# Patient Record
Sex: Female | Born: 1940 | Race: Black or African American | Hispanic: No | Marital: Married | Smoking: Never smoker
Health system: Southern US, Community
[De-identification: ages and names within clinical notes are randomized; demographics above are authoritative.]

## PROBLEM LIST (undated history)

## (undated) DIAGNOSIS — I639 Cerebral infarction, unspecified: Secondary | ICD-10-CM

## (undated) DIAGNOSIS — R569 Unspecified convulsions: Secondary | ICD-10-CM

## (undated) DIAGNOSIS — I10 Essential (primary) hypertension: Secondary | ICD-10-CM

## (undated) HISTORY — DX: Essential (primary) hypertension: I10

---

## 2018-03-23 ENCOUNTER — Emergency Department
Admission: EM | Admit: 2018-03-23 | Discharge: 2018-03-23 | Disposition: A | Discharge status: Home / Self Care | Payer: PRIVATE HEALTH INSURANCE | Attending: Emergency Medicine | Admitting: Emergency Medicine

## 2018-03-23 ENCOUNTER — Emergency Department: Payer: PRIVATE HEALTH INSURANCE

## 2018-03-23 ENCOUNTER — Encounter: Payer: Self-pay | Admitting: Emergency Medicine

## 2018-03-23 DIAGNOSIS — R627 Adult failure to thrive: Secondary | ICD-10-CM | POA: Insufficient documentation

## 2018-03-23 DIAGNOSIS — N390 Urinary tract infection, site not specified: Secondary | ICD-10-CM | POA: Insufficient documentation

## 2018-03-23 DIAGNOSIS — R531 Weakness: Secondary | ICD-10-CM

## 2018-03-23 HISTORY — DX: Unspecified convulsions: R56.9

## 2018-03-23 LAB — URINALYSIS, COMPLETE (UACMP) WITH MICROSCOPIC
Bilirubin Urine: NEGATIVE
GLUCOSE, UA: NEGATIVE mg/dL
Ketones, ur: NEGATIVE mg/dL
Nitrite: NEGATIVE
PH: 7 (ref 5.0–8.0)
Protein, ur: NEGATIVE mg/dL
SPECIFIC GRAVITY, URINE: 1.008 (ref 1.005–1.030)
Squamous Epithelial / LPF: >50 — ABNORMAL HIGH (ref 0–5)

## 2018-03-23 LAB — CBC WITH DIFFERENTIAL/PLATELET
BASOS PCT: 0 %
Basophils Absolute: 0 10*3/uL (ref 0–0.1)
EOS ABS: 0 10*3/uL (ref 0–0.7)
EOS PCT: 0 %
HCT: 30.7 % — ABNORMAL LOW (ref 35.0–47.0)
Hemoglobin: 10.2 g/dL — ABNORMAL LOW (ref 12.0–16.0)
Lymphocytes Relative: 40 %
Lymphs Abs: 1.9 10*3/uL (ref 1.0–3.6)
MCH: 31.1 pg (ref 26.0–34.0)
MCHC: 33.4 g/dL (ref 32.0–36.0)
MCV: 93.3 fL (ref 80.0–100.0)
MONOS PCT: 10 %
Monocytes Absolute: 0.5 10*3/uL (ref 0.2–0.9)
Neutro Abs: 2.5 10*3/uL (ref 1.4–6.5)
Neutrophils Relative %: 50 %
PLATELETS: 180 10*3/uL (ref 150–440)
RBC: 3.29 MIL/uL — ABNORMAL LOW (ref 3.80–5.20)
RDW: 12.6 % (ref 11.5–14.5)
WBC: 4.9 10*3/uL (ref 3.6–11.0)

## 2018-03-23 LAB — COMPREHENSIVE METABOLIC PANEL
ALK PHOS: 56 U/L (ref 38–126)
ALT: 12 U/L — AB (ref 14–54)
AST: 25 U/L (ref 15–41)
Albumin: 3.5 g/dL (ref 3.5–5.0)
Anion gap: 7 (ref 5–15)
BUN: 16 mg/dL (ref 6–20)
CALCIUM: 9.5 mg/dL (ref 8.9–10.3)
CHLORIDE: 107 mmol/L (ref 101–111)
CO2: 24 mmol/L (ref 22–32)
CREATININE: 0.8 mg/dL (ref 0.44–1.00)
GFR calc Af Amer: >60 mL/min (ref >60)
Glucose, Bld: 88 mg/dL (ref 65–99)
Potassium: 3.8 mmol/L (ref 3.5–5.1)
Sodium: 138 mmol/L (ref 135–145)
Total Bilirubin: 1.7 mg/dL — ABNORMAL HIGH (ref 0.3–1.2)
Total Protein: 7.4 g/dL (ref 6.5–8.1)

## 2018-03-23 LAB — TROPONIN I

## 2018-03-23 MED ORDER — CEFTRIAXONE SODIUM 1 G IJ SOLR
1.0000 g | Freq: Once | INTRAMUSCULAR | Status: AC
  Administered 2018-03-23: 1 g via INTRAMUSCULAR
  Filled 2018-03-23: qty 10

## 2018-03-23 MED ORDER — LIDOCAINE HCL (PF) 1 % IJ SOLN
INTRAMUSCULAR | Status: AC
  Administered 2018-03-23: 5 mL
  Filled 2018-03-23: qty 5

## 2018-03-23 NOTE — ED Provider Notes (Signed)
[facility] Emergency Department Provider Note  ____________________________________________   First MD Initiated Contact with Patient 03/23/18 1714     (approximate)  I have reviewed the triage vital signs and the nursing notes.   HISTORY  Chief Complaint Weakness  Level 5 caveat:  history/ROS limited by altered mental status/confusion   HPI Lacey Ray is a 77 y.o. female with no contributory past medical history who presents with her family today for evaluation of a gradually worsening generalized weakness and inability to participate in her activities of daily living over the last month.  The patient's family states that there is nothing acute in onset but that is been gradually getting worse and they have not been able to convince her to come to the doctor until today.  She has no primary care doctor and she has been noncompliant for the last few years with the seizure medicine she was on for her entire life.  She has not been having any seizures of which they are aware.  She simply has gotten to the point where she can no longer care for herself or stand up and move around or even ambulate by herself.  The family is able to assist her with 1% on each side getting around the house.  She does not eat very much or drink very much.  She has no complaints and she did not want to come to the doctor.  She is more "out of it" than usual.  She does not seem to have been ill with a fever.  She is not having trouble breathing or complaining of any chest pain or abdominal pain.  She has not been having any nausea or vomiting or complaining of pain when she urinates.  Her bowel movements are basically normal.  Her symptoms are severe and gradual in onset and nothing makes it better or worse.  Past Medical History:  Diagnosis Date  . Seizures (HCC)     There are no active problems to display for this patient.   History reviewed. No pertinent surgical  history.  Prior to Admission medications   Not on File    Allergies Patient has no allergy information on record.  No family history on file.  Social History Social History   Tobacco Use  . Smoking status: Never Smoker  . Smokeless tobacco: Never Used  Substance Use Topics  . Alcohol use: Never    Frequency: Never  . Drug use: Never    Review of Systems Level 5 caveat:  history/ROS limited by altered mental status/confusion.  See the HPI above for details from the family   ____________________________________________   PHYSICAL EXAM:  VITAL SIGNS: ED Triage Vitals [03/23/18 1321]  Enc Vitals Group     BP (!) 171/78     Pulse Rate 79     Resp 18     Temp 98.6 F (37 C)     Temp Source Oral     SpO2 100 %     Weight 52.2 kg (115 lb)     Height 1.575 m ( )     Head Circumference      Peak Flow      Pain Score 0     Pain Loc      Pain Edu?      Excl. in GC?     Constitutional: Alert, seems confused but according the family she is at the baseline that she has been for about a month.  She  does answer questions and is in no acute distress but appears older than her chronological age. Eyes: Conjunctivae are normal.  Head: Atraumatic. Nose: No congestion/rhinnorhea. Mouth/Throat: Mucous membranes are moist. Neck: No stridor.  No meningeal signs.   Cardiovascular: Normal rate, regular rhythm. Good peripheral circulation. Grossly normal heart sounds. Respiratory: Normal respiratory effort.  No retractions. Lungs CTAB. Gastrointestinal: Soft and nontender. No distention.  Musculoskeletal: No lower extremity tenderness nor edema. No gross deformities of extremities. Neurologic: Decreased and slow speech and language but she will answer questions appropriately. No gross focal neurologic deficits are appreciated; although she seems to have some contractions of her legs, she felt very strongly when the had to get in and out catheterization and she clearly has good  muscle strength throughout both arms and legs. Skin:  Skin is warm, dry and intact. No rash noted.   ____________________________________________   LABS (all labs ordered are listed, but only abnormal results are displayed)  Labs Reviewed  COMPREHENSIVE METABOLIC PANEL - Abnormal; Notable for the following components:      Result Value   ALT 12 (*)    Total Bilirubin 1.7 (*)    All other components within normal limits  CBC WITH DIFFERENTIAL/PLATELET - Abnormal; Notable for the following components:   RBC 3.29 (*)    Hemoglobin 10.2 (*)    HCT 30.7 (*)    All other components within normal limits  URINALYSIS, COMPLETE (UACMP) WITH MICROSCOPIC - Abnormal; Notable for the following components:   Color, Urine YELLOW (*)    APPearance TURBID (*)    Hgb urine dipstick SMALL (*)    Leukocytes, UA MODERATE (*)    Bacteria, UA RARE (*)    Squamous Epithelial / LPF >50 (*)    All other components within normal limits  URINE CULTURE  TROPONIN I   ____________________________________________  EKG  ED ECG REPORT I, Loleta Aviv, the attending physician, personally viewed and interpreted this ECG.  Date: 03/23/2018 EKG Time: 13: 27 Rate: 70 Rhythm: normal sinus rhythm QRS Axis: normal Intervals: normal ST/T Wave abnormalities: Non-specific ST segment / T-wave changes, but no evidence of acute ischemia. Narrative Interpretation: no evidence of acute ischemia   ____________________________________________  RADIOLOGY   ED MD interpretation: No indication of acute issues on head CT and brain MRI.  Patient has chronic changes  Official radiology report(s): Ct Head Wo Contrast  Result Date: 03/23/2018 CLINICAL DATA:  Weakness in hands and feet. EXAM: CT HEAD WITHOUT CONTRAST TECHNIQUE: Contiguous axial images were obtained from the base of the skull through the vertex without intravenous contrast. COMPARISON:  None. FINDINGS: Brain: No subdural, epidural, or subarachnoid  hemorrhage. Ventricles and sulci are prominent. There is increased CSF around the cerebellum suggesting some cerebellar atrophy. No masses. Basal cisterns are normal. No midline shift. White matter changes are identified. No acute cortical ischemia or infarct is noted. Vascular: Calcified atherosclerotic changes in the intracranial carotids. Skull: Normal. Negative for fracture or focal lesion. Sinuses/Orbits: No acute finding. Other: None. IMPRESSION: 1. No acute intracranial abnormality. 2. Relative cerebellar atrophy of uncertain etiology. 3. White matter changes. Electronically Signed   By: Gerome Sam III M.D   On: 03/23/2018 14:00   Mr Brain Wo Contrast  Result Date: 03/23/2018 CLINICAL DATA:  Initial evaluation for generalized weakness, more prominent on the right, slurred speech. EXAM: MRI HEAD WITHOUT CONTRAST TECHNIQUE: Multiplanar, multiecho pulse sequences of the brain and surrounding structures were obtained without intravenous contrast. COMPARISON:  Prior CT from earlier  the same day. FINDINGS: Brain: Study degraded by motion artifact. Generalized cerebral atrophy, with fairly advanced cerebellar atrophy. This could be related to chronic antiepileptic therapy given history of seizure. Patchy T2/FLAIR hyperintensity within the periventricular and deep white matter both cerebral hemispheres, most consistent with chronic small vessel ischemic changes. Chronic microvascular changes present within the pons as well. Superimposed multiple remote lacunar infarcts seen involving the bilateral basal ganglia/corona radiata as well as the thalami. No abnormal foci of restricted diffusion to suggest acute or subacute ischemia. Gray-white matter differentiation maintained. No other areas of remote cortical infarction. No acute intracranial hemorrhage. Few punctate foci susceptibility artifact scattered throughout the supratentorial brain, consistent with small chronic micro hemorrhages. Findings suspected to  be hypertensive in nature, although amyloid angiopathy could conceivably have this appearance as well. No mass lesion, midline shift or mass effect. No hydrocephalus. No extra-axial fluid collection. Major dural sinuses are grossly patent. Pituitary gland suprasellar region normal. Midline structures intact and normal. Vascular: Major intracranial vascular flow voids are maintained. Skull and upper cervical spine: Craniocervical junction normal. Moderate degenerate spondylolysis noted within the upper cervical spine with moderate diffuse spinal stenosis. Bone marrow signal intensity within normal limits. No scalp soft tissue abnormality. Sinuses/Orbits: Globes and orbital soft tissues within normal limits. Paranasal sinuses are clear. No mastoid effusion. Inner ear structures normal. Other: None. IMPRESSION: 1. No acute intracranial abnormality. 2. 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 3. Advanced cerebellar atrophy, which could be related to chronic antiepileptic therapy given history of seizure. 4. 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 Finding could be further assessed with dedicated MRI of the cervical spine as clinically desired. Electronically Signed   By: Rise Mu M.D.   On: 03/23/2018 20:21    ____________________________________________   PROCEDURES  Critical Care performed: No   Procedure(s) performed:   Procedures   ____________________________________________   INITIAL IMPRESSION / ASSESSMENT AND PLAN / ED COURSE  As part of my medical decision making, I reviewed the following data within the electronic MEDICAL RECORD NUMBER History obtained from family, Nursing notes reviewed and incorporated, Labs reviewed , EKG interpreted  and Old chart reviewed    Differential diagnosis includes, but is not  limited to, failure to thrive and gradual decline over time, dementia, CVA (subacute), acute infection such as urinary tract infection, metabolic abnormality, less likely ACS.  I will perform a broad work-up but I explained to the family that if there is no evidence of any acute or treatable issue at this time, we may need to talk about placement after PT/OT evaluation and social work involvement.  However I will evaluate broadly and obtain an MR of her brain to look for any evidence of recent infarction.  She has good muscle strength throughout, it is almost as if she does not want to participate in her activities.  The family is in agreement with the current plan.  Clinical Course as of Mar 24 14  Mon Mar 23, 2018  1836 Unclear UA results, possible UTI, but heavy squamous cell involvement, WBCs may simply represent contamination.  Urinalysis, Complete w Microscopic(!) [CF]  2042 The patient's clinical status is unchanged.  More family members are currently present including both her daughter and her husband.  I explained the situation, that there may be a mild urinary tract infection but that her MR brain did not show any acute abnormalities.  I explained that I do not have any inpatient criteria by which to admit her, but I could keep her in the emergency department for physical therapy/occupational therapy evaluation and social work placement, but I also explained that placement in a skilled nursing facility or rehab facility is not guaranteed and that will be dependent upon the finances and insurance payments.  The family is all comfortable with taking her home tonight and try to get established with a primary care doctor.  I will provide the patient navigator's phone number and encourage close outpatient follow-up as soon as possible.  I also gave strict return precautions if her clinical status decreases.Because of her unwillingness to cooperate with taking medications, we will give her a dose of  Rocephin 1 g intramuscular that should treat mild urinary tract infections.  A urine culture is pending and she will be contacted if they need a different type of treatment.   [CF]    Clinical Course User Index [CF] Loleta Haifa, MD    ____________________________________________  FINAL CLINICAL IMPRESSION(S) / ED DIAGNOSES  Final diagnoses:  Generalized weakness  Urinary tract infection without hematuria, site unspecified  Failure to thrive in adult     MEDICATIONS GIVEN DURING THIS VISIT:  Medications  cefTRIAXone (ROCEPHIN) injection 1 g (1 g Intramuscular Given 03/23/18 2145)  lidocaine (PF) (XYLOCAINE) 1 % injection (5 mLs  Given 03/23/18 2146)     ED Discharge Orders    None       Note:  This document was prepared using Dragon voice recognition software and may include unintentional dictation errors.    Loleta Mehak, MD 03/24/18 [phone]

## 2018-03-23 NOTE — ED Triage Notes (Signed)
Pt arrived with family with complaints of weakness in hands and feet. Per family this has been going months and they are had a hard time getting her to come in. Baseline is walking independently and oriented x 4. Pt's speech slurred and delayed which family states started "months ago." Pt does have seizures but has not been cooperative with regimen for "awhile" per family. Last seizure reports a month ago. More weakness prominent of the right side.

## 2018-03-23 NOTE — Discharge Instructions (Signed)
Your workup in the Emergency Department today was reassuring.  We did not find any specific abnormalities other than possibly a very mild urinary tract infection for which we gave you a shot of antibiotics.  The most important thing is to get established with a primary care doctor to evaluate your options for home health or assisted living placement.  We offered to keep you in the emergency department tonight for physical therapy and occupational therapy evaluation tomorrow and for social worker to talk with you about placement in another living facility, but your family declined and we think it is appropriate at this time based on the fact that you have a lot of help at home.  However, please return to the Emergency Department if you develop new or worsening symptoms that concern you.

## 2018-03-23 NOTE — ED Notes (Signed)
Patient transported to CT/MRI 

## 2018-03-26 LAB — URINE CULTURE: SPECIAL REQUESTS: NORMAL

## 2018-03-27 NOTE — Progress Notes (Signed)
ED CULTURE   77 yo female presented to the ED with generalized weakness and failure to thrive. She did not present with any flank pain or symptoms of dysuria or fever. She also has a history of non-compliance and does not have a PCP. Her UA was dirty with > 50 squamous cells and also showed rare bacteria and some WBC's and a urine culture was obtained. She was diagnosed with a mild UTI and treated with Ceftriaxone 1g IM once d/t concerns about compliance. Urine culture resulted on 03/27/2018 showing E coli sensitive to Ceftriaxone. Given lack of UTI symptoms, dirty UA, and that E coli was sensitive to Ceftriaxone, no further treatment is needed at this time.   Results for orders placed or performed during the hospital encounter of 03/23/18  Urine Culture     Status: Abnormal   Collection Time: 03/23/18  6:10 PM  Result Value Ref Range Status   Specimen Description   Final    URINE, CATHETERIZED Performed at [facility], [address] [place], [place] [address]    Special Requests   Final    Normal Performed at [facility], [address] [place], [place] [address]    Culture >=100,000 COLONIES/mL ESCHERICHIA COLI (A)  Final   Report Status 03/26/2018 FINAL  Final   Organism ID, Bacteria ESCHERICHIA COLI (A)  Final      Susceptibility   Escherichia coli - MIC*    AMPICILLIN <=2 SENSITIVE Sensitive     CEFAZOLIN <=4 SENSITIVE Sensitive     CEFTRIAXONE <=1 SENSITIVE Sensitive     CIPROFLOXACIN 0.5 SENSITIVE Sensitive     GENTAMICIN <=1 SENSITIVE Sensitive     IMIPENEM <=0.25 SENSITIVE Sensitive     NITROFURANTOIN 32 SENSITIVE Sensitive     TRIMETH/SULFA <=20 SENSITIVE Sensitive     AMPICILLIN/SULBACTAM <=2 SENSITIVE Sensitive     * >=100,000 COLONIES/mL ESCHERICHIA COLI    Yolanda Bonine, PharmD Pharmacy Resident

## 2018-04-16 ENCOUNTER — Encounter: Payer: Self-pay | Admitting: Nurse Practitioner

## 2018-04-16 ENCOUNTER — Ambulatory Visit (INDEPENDENT_AMBULATORY_CARE_PROVIDER_SITE_OTHER): Payer: PRIVATE HEALTH INSURANCE | Admitting: Nurse Practitioner

## 2018-04-16 VITALS — BP 193/79 | HR 66 | Temp 98.5°F | Ht 62.0 in

## 2018-04-16 DIAGNOSIS — I10 Essential (primary) hypertension: Secondary | ICD-10-CM | POA: Diagnosis not present

## 2018-04-16 DIAGNOSIS — Z7689 Persons encountering health services in other specified circumstances: Secondary | ICD-10-CM | POA: Diagnosis not present

## 2018-04-16 DIAGNOSIS — R569 Unspecified convulsions: Secondary | ICD-10-CM

## 2018-04-16 DIAGNOSIS — N3001 Acute cystitis with hematuria: Secondary | ICD-10-CM | POA: Diagnosis not present

## 2018-04-16 DIAGNOSIS — R531 Weakness: Secondary | ICD-10-CM | POA: Diagnosis not present

## 2018-04-16 DIAGNOSIS — R627 Adult failure to thrive: Secondary | ICD-10-CM | POA: Diagnosis not present

## 2018-04-16 LAB — POCT URINALYSIS DIPSTICK
Bilirubin, UA: NEGATIVE
Glucose, UA: NEGATIVE
Ketones, UA: NEGATIVE
Nitrite, UA: POSITIVE
Protein, UA: NEGATIVE
Spec Grav, UA: 1.015 (ref 1.010–1.025)
Urobilinogen, UA: 0.2 E.U./dL
pH, UA: 6.5 (ref 5.0–8.0)

## 2018-04-16 MED ORDER — AMLODIPINE BESYLATE 5 MG PO TABS: 5.0000 mg | ORAL_TABLET | Freq: Every day | ORAL | 3 refills | Status: DC

## 2018-04-16 MED ORDER — CEPHALEXIN 500 MG PO CAPS: 500.0000 mg | ORAL_CAPSULE | Freq: Two times a day (BID) | ORAL | 0 refills | Status: AC

## 2018-04-16 MED ORDER — LEVETIRACETAM 500 MG PO TABS: 500.0000 mg | ORAL_TABLET | Freq: Two times a day (BID) | ORAL | 2 refills | Status: DC

## 2018-04-16 NOTE — Progress Notes (Signed)
Subjective:    Patient ID: Lacey Ray, female    DOB: 10/19/1941, 77 y.o.   MRN: [id number]  Lacey Ray is a 77 y.o. female presenting on 04/16/2018 for Establish Care (recently seen in the ED on 5/6 diagnosed w/ failure thrive and UTI)   HPI Establish Care New Provider Pt last seen by PCP Dr. Maryellen Pile in 2002.  Pt is accompanied today to clinic by her daughter Amie Critchley, a family friend, and her husband Lyda Jester.  ED Followup: UTI Pt has most recently been seen in ED and was diagnosed with UTI.  UTI was only treated with single dose IM Rocephin, which is not likely to have resolved UTI.  Culture at that time confirmed e.Coli UTI without any drug resistance.  Failure to Thrive  Pt and family report she has a good appetite and eats regular meals.  States she eats 3 meals per day.  She has had significant muscle weakness for several years per family.  Pt's husband Lyda Jester is her primary caregiver and is able to lift patient as needed for assisting with ADLs and iADLs.  Pt also gets significant assistance from family and friends.  Seizure Disorder Last seizure was about 3 months ago. Family describes seizures as pt "falls asleep and is disoriented and takes time to become alert when waking up."  Previously were grand mal seizures.  Much more mild now.  Family denies absence seizures. - No seizure medication as last refill was in 2002.   - In ED, CT head was performed without evidence of stroke.  Per family they were not able to dx R arm weakness as CVA.  They also report she has had speech difficulty      Past Medical History:  Diagnosis Date  . Seizures (HCC)    No past surgical history on file. Social History   Socioeconomic History  . Marital status: Married    Spouse name: Not on file  . Number of children: Not on file  . Years of education: Not on file  . Highest education level: Not on file  Occupational History  . Not on file  Social Needs  . Financial resource strain:  Not on file  . Food insecurity:    Worry: Not on file    Inability: Not on file  . Transportation needs:    Medical: Not on file    Non-medical: Not on file  Tobacco Use  . Smoking status: Never Smoker  . Smokeless tobacco: Never Used  Substance and Sexual Activity  . Alcohol use: Never    Frequency: Never  . Drug use: Never  . Sexual activity: Not on file  Lifestyle  . Physical activity:    Days per week: Not on file    Minutes per session: Not on file  . Stress: Not on file  Relationships  . Social connections:    Talks on phone: Not on file    Gets together: Not on file    Attends religious service: Not on file    Active member of club or organization: Not on file    Attends meetings of clubs or organizations: Not on file    Relationship status: Not on file  . Intimate partner violence:    Fear of current or ex partner: Not on file    Emotionally abused: Not on file    Physically abused: Not on file    Forced sexual activity: Not on file  Other Topics Concern  . Not  on file  Social History Narrative  . Not on file   No family history on file. No current outpatient medications on file prior to visit.   No current facility-administered medications on file prior to visit.     Review of Systems  Constitutional: Negative for activity change, appetite change and unexpected weight change.  HENT: Negative.   Eyes: Negative.   Respiratory: Negative.   Cardiovascular: Negative.   Gastrointestinal: Negative.   Endocrine: Negative.   Genitourinary: Positive for difficulty urinating (incontinence).  Musculoskeletal: Positive for gait problem (Pt unable to walk independently).  Skin: Negative.   Allergic/Immunologic: Negative.   Neurological: Positive for seizures, facial asymmetry, speech difficulty and weakness.  Hematological: Negative.   Psychiatric/Behavioral: Positive for decreased concentration and sleep disturbance. Negative for agitation and behavioral  problems.   Per HPI unless specifically indicated above     Objective:    BP (!) 193/79 (BP Location: Right Arm, Patient Position: Sitting, Cuff Size: Small)   Pulse 66   Temp 98.5 F (36.9 C) (Oral)   Ht  (1.575 m)   SpO2 100%   BMI 21.03 kg/m   Wt Readings from Last 3 Encounters:  03/23/18 115 lb (52.2 kg)    Physical Exam  Constitutional: She is oriented to person, place, and time. Vital signs are normal. She appears well-developed and well-nourished. She appears lethargic. She appears cachectic. No distress.  HENT:  Head: Normocephalic and atraumatic.  Cardiovascular: Normal rate, regular rhythm, S1 normal, S2 normal, normal heart sounds and intact distal pulses.  Pulmonary/Chest: Effort normal and breath sounds normal. No respiratory distress.  Abdominal: Soft.  Neurological: She is oriented to person, place, and time. She appears lethargic. She displays atrophy. She displays no tremor. No sensory deficit. She exhibits abnormal muscle tone (2/5 strength legs, 3/5 strength arms). She displays seizure activity.  Pt has had multiple episodes during exam c/w absence seizures.  Pt stares off without engagement in conversation and picks back up when she is able after absence event.  Skin: Skin is warm and dry. Capillary refill takes less than 2 seconds.  Psychiatric: Her affect is blunt. Her speech is delayed. She is withdrawn. Cognition and memory are impaired. She does not express impulsivity or inappropriate judgment. She is communicative. She is inattentive.  Vitals reviewed.  Results for orders placed or performed in visit on 04/16/18  POCT urinalysis dipstick  Result Value Ref Range   Color, UA dark color    Clarity, UA cloudy    Glucose, UA Negative Negative   Bilirubin, UA negative    Ketones, UA negative    Spec Grav, UA 1.015 1.010 - 1.025   Blood, UA moderate    pH, UA 6.5 5.0 - 8.0   Protein, UA Negative Negative   Urobilinogen, UA 0.2 0.2 or 1.0 E.U./dL    Nitrite, UA positive    Leukocytes, UA Moderate (2+) (A) Negative   Appearance     Odor        Assessment & Plan:   Problem List Items Addressed This Visit      Cardiovascular and Mediastinum   Essential hypertension Uncontrolled hypertension.  BP goal < 140/90 given frail elder adult.  Not currently taking medications.  No currently identified complications, but pt at risk for hemorrhagic stroke at current BP on exam today.  Plan: 1. Start taking amlodipine 5 mg once daily 2. Labs reviewed from ED  3. Encouraged heart healthy diet and increasing exercise to 30 minutes most  days of the week. 4. Check BP 1-2 x per week at home, keep log, and bring to clinic at next appointment. 5. Follow up 3 months and sooner if BP remains elevated.   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 Extremity weakness, speech slurring, delayed speech are all signs of chronic, uncontrolled seizures.  Pt was previously managed on phenobarbital and dilantin.  Family reported pt did not tolerate dilantin.  Plan: 1. START Keppra twice daily for seizures. 2. Referral neurology - pt will need EEG. 3. Followup 3 months.  keppra level at that time.   Relevant Medications   levETIRAcetam (KEPPRA) 500 MG tablet   Other Relevant Orders   Ambulatory referral to Neurology   Generalized weakness   Relevant Orders   Ambulatory referral to Home Health   Failure to thrive in adult Pt with significant weakness, muscle atrophy and cachexia.  No suspicion of neglect by family.  Pt is well groomed and reports good appetite/regular meal intake.  Likely is result of seizures/lack of activity unmanaged for many years.  Plan: 1. Seizure management as above. 2. Encouraged high calorie foods. 3. Referral home health for PT eval /treat.  Goal will be easier transfers for patient for toileting and self care.   Relevant Orders     Ambulatory referral to Home Health    Other Visit Diagnoses    Acute cystitis with hematuria    -  Primary Persistent UTI noted on UA today.  Inadequate treatment regimen/followup from ED visit 03/23/2018.   Likely is also contributing to patient's neuro status today. No s/sx of systemic infection today.  Plan: 1. START Keflex 500 mg bid x 7 days. 2. Reviewed s/sx of UTI with family.  Return to clinic if not resolving.   Relevant Medications   cephALEXin (KEFLEX) 500 MG capsule   Other Relevant Orders   POCT urinalysis dipstick (Completed)   Encounter to establish care     Previous PCP was at Dr. Maryellen Pile in 2002.  Records will not be requested.  Past medical, family, and surgical history reviewed w/ pt and family in clinic today.       Meds ordered this encounter  Medications  . levETIRAcetam (KEPPRA) 500 MG tablet    Sig: Take 1 tablet (500 mg total) by mouth 2 (two) times daily.    Dispense:  60 tablet    Refill:  2    Order Specific Question:   Supervising Provider    Answer:   Smitty Cords [[id number]]  . amLODipine (NORVASC) 5 MG tablet    Sig: Take 1 tablet (5 mg total) by mouth daily.    Dispense:  90 tablet    Refill:  3    Order Specific Question:   Supervising Provider    Answer:   Smitty Cords [[id number]]  . cephALEXin (KEFLEX) 500 MG capsule    Sig: Take 1 capsule (500 mg total) by mouth 2 (two) times daily for 7 days.    Dispense:  14 capsule    Refill:  0    Order Specific Question:   Supervising Provider    Answer:   Smitty Cords [[id number]]     Follow up plan: Return in about 3 months (around 07/17/2018) for hypertension, seizures.  Wilhelmina Mcardle, DNP, AGPCNP-BC Adult Gerontology Primary Care Nurse Practitioner [facility]  04/17/2018, 9:21 AM

## 2018-04-16 NOTE — Patient Instructions (Addendum)
Lacey Ray,   Thank you for coming in to clinic today.  1. Blood pressure goal <140/90.   - START amlodipine 5 mg once daily.  Call clinic if BP > 160/100 in 1-2 weeks when checked at home.   2. Seizures: START Keppra (levitracetam) 500 mg twice daily.   Please schedule a follow-up appointment with Wilhelmina Mcardle, AGNP. Return in about 3 months (around 07/17/2018) for hypertension, seizures.  OR 1 month for blood pressure if unable to check at home.  If you have any other questions or concerns, please feel free to call the clinic or send a message through MyChart. You may also schedule an earlier appointment if necessary.  You will receive a survey after today's visit either digitally by e-mail or paper by [employer]. Your experiences and feedback matter to [place].  Please respond so we know how we are doing as we provide care for you.   Wilhelmina Mcardle, DNP, AGNP-BC Adult Gerontology Nurse Practitioner [facility], [facility]

## 2018-04-17 ENCOUNTER — Encounter: Payer: Self-pay | Admitting: Nurse Practitioner

## 2018-04-17 DIAGNOSIS — I10 Essential (primary) hypertension: Secondary | ICD-10-CM | POA: Insufficient documentation

## 2018-04-17 DIAGNOSIS — R627 Adult failure to thrive: Secondary | ICD-10-CM | POA: Insufficient documentation

## 2018-04-17 DIAGNOSIS — R531 Weakness: Secondary | ICD-10-CM | POA: Insufficient documentation

## 2018-04-17 DIAGNOSIS — R569 Unspecified convulsions: Secondary | ICD-10-CM | POA: Insufficient documentation

## 2018-06-29 ENCOUNTER — Ambulatory Visit (INDEPENDENT_AMBULATORY_CARE_PROVIDER_SITE_OTHER): Payer: PRIVATE HEALTH INSURANCE | Admitting: Nurse Practitioner

## 2018-06-29 ENCOUNTER — Encounter: Payer: Self-pay | Admitting: Nurse Practitioner

## 2018-06-29 VITALS — BP 146/60 | HR 60 | Temp 98.6°F | Ht 62.0 in

## 2018-06-29 DIAGNOSIS — R569 Unspecified convulsions: Secondary | ICD-10-CM | POA: Diagnosis not present

## 2018-06-29 DIAGNOSIS — I10 Essential (primary) hypertension: Secondary | ICD-10-CM

## 2018-06-29 DIAGNOSIS — R627 Adult failure to thrive: Secondary | ICD-10-CM | POA: Diagnosis not present

## 2018-06-29 MED ORDER — AMLODIPINE BESYLATE 10 MG PO TABS: 10.0000 mg | ORAL_TABLET | Freq: Every day | ORAL | 1 refills | Status: DC

## 2018-06-29 NOTE — Patient Instructions (Addendum)
Ernesto Rutherfordose M Vanhook,   Thank you for coming in to clinic today.  1. Continue Keppra without changes.  2. INCREASE amlodipine 10 mg once daily.  3. Labs today at [facility].  If you only get one lab collected, have levetiracetam level drawn please.  All are important, but that is the most important for her new medication.  Please schedule a follow-up appointment with Wilhelmina McardleLauren Tacy Chavis, AGNP. Return in about 3 months (around 09/29/2018) for hypertension AND seizures.  If you have any other questions or concerns, please feel free to call the clinic or send a message through MyChart. You may also schedule an earlier appointment if necessary.  You will receive a survey after today's visit either digitally by e-mail or paper by [employer] mail. Your experiences and feedback matter to us.  Please respond so we know how we are doing as we provide care for you.   Wilhelmina McardleLauren Lada Fulbright, DNP, AGNP-BC Adult Gerontology Nurse Practitioner [facility], [facility]

## 2018-06-29 NOTE — Assessment & Plan Note (Addendum)
Patient appears to have had some weight gain since last visit.  Remains malnourished with mild cachexia.  Encourage patient and family to do simple exercises daily to lift leg off chair and extend lower leg to straighten knee while seated and bring back to bent, resting state.  Continue regular oral intake of food.  Consider Boost/Ensure supplement if tolerated.  Encourage adequate protein intake. Labs today.  Evaluate deficiency anemia, electrolyte abnormalities. Followup 3 months.

## 2018-06-29 NOTE — Progress Notes (Signed)
Subjective:    Patient ID: Lacey RutherfordRose M Starnes, female    DOB: 01/21/1941, 77 y.o.   MRN: [id number]  Lacey Ray is a 77 y.o. female presenting on 06/29/2018 for Hypertension and Seizures   HPI Hypertension - She is not checking BP at home or outside of clinic.    - Current medications: amlodipine 5 mg once daily, tolerating well without side effects - She is not currently symptomatic. - Pt denies headache, lightheadedness, dizziness, changes in vision, chest tightness/pressure, palpitations, leg swelling, sudden loss of speech or loss of consciousness. - She  reports no regular exercise routine. - Her diet is moderate in salt, moderate in fat, and moderate in carbohydrates.   Seizure disorder Patient has started taking levetiracetam 500 mg bid.  She has had significant reduction in daytime napping according to family.  They have not noticed much difference in her communication or attention, but is not spacing out as often.  Patient and family report neurology visit is upcoming next week.  She is scheduled to see Dr. Cristopher PeruHemang Shah on 07/02/2018.  Failure to Thrive Patient continues to eat well.  Patient and family state it appears she may have gained weight since last visit.  Social History   Tobacco Use  . Smoking status: Never Smoker  . Smokeless tobacco: Never Used  Substance Use Topics  . Alcohol use: Never    Frequency: Never  . Drug use: Never    Review of Systems Per HPI unless specifically indicated above     Objective:    BP (!) 146/60 (BP Location: Right Arm, Patient Position: Sitting, Cuff Size: Small)   Pulse 60   Temp 98.6 F (37 C) (Oral)   Ht 5\' 2"  (1.575 m)   BMI 21.03 kg/m    Wt Readings from Last 3 Encounters:  03/23/18 115 lb (52.2 kg)    Physical Exam  Constitutional: She appears well-developed and well-nourished. No distress.  HENT:  Head: Normocephalic and atraumatic.  Cardiovascular: Normal rate, regular rhythm, S1 normal, S2 normal, normal  heart sounds and intact distal pulses.  Pulmonary/Chest: Effort normal and breath sounds normal. No respiratory distress.  Neurological: She is alert. She is disoriented (can state her name only,  Disoriented to time, place, situation). She displays atrophy. She exhibits abnormal muscle tone (very weak with strength of all extremities 2/5). Gait (pt is non-ambulatory 2/2 decreased strength) abnormal. GCS eye subscore is 4. GCS verbal subscore is 5. GCS motor subscore is 6.  Unable to complete full CN exam.  No limitations of CN 2-10.  Patient was unable to follow instructions to complete CN 11-12.  Skin: Skin is warm and dry. Capillary refill takes less than 2 seconds.  Psychiatric: She has a normal mood and affect. Her speech is delayed. She is slowed and withdrawn. Cognition and memory are impaired. She expresses inappropriate judgment. She does not express impulsivity. She is attentive.  Vitals reviewed.    Results for orders placed or performed in visit on 04/16/18  POCT urinalysis dipstick  Result Value Ref Range   Color, UA dark color    Clarity, UA cloudy    Glucose, UA Negative Negative   Bilirubin, UA negative    Ketones, UA negative    Spec Grav, UA 1.015 1.010 - 1.025   Blood, UA moderate    pH, UA 6.5 5.0 - 8.0   Protein, UA Negative Negative   Urobilinogen, UA 0.2 0.2 or 1.0 E.U./dL   Nitrite, UA positive  Leukocytes, UA Moderate (2+) (A) Negative   Appearance     Odor        Assessment & Plan:   Problem List Items Addressed This Visit      Cardiovascular and Mediastinum   Essential hypertension - Primary    Currently improved, but remains above goal and uncontrolled.  BP goal < 140/90 due to medical frailty in elder adult.  Pt is not working on lifestyle modifications.  Taking medications tolerating well without side effects. No currently identified complications, but no renal function check to date.  Plan: 1. Increase amlodipine to 10 mg once daily.  2. Obtain  labs CMP, CBC, keppra level today  3. Encouraged heart healthy diet and increasing exercise to 30 minutes most days of the week. 4. Check BP 1-2 x per week at home, keep log, and bring to clinic at next appointment. 5. Follow up 3 months.        Relevant Medications   amLODipine (NORVASC) 10 MG tablet   Other Relevant Orders   Comprehensive metabolic panel   CBC with Differential/Platelet   Levetiracetam level     Other   Seizure (HCC)    Likely improved on Keppra with evidence of fewer post-ictal periods with fewer daytime naps.  Likely, patient was experiencing absence seizures, which have reduced in frequency.  Cognitive ability still lags, but patient is now gaining some weight and appears to be mildly stronger on exam today.  Plan: 1. Continue Keppra 2. Lab to draw Keppra level today or tomorrow. 3. Continue with Neurology appointment as scheduled with Dr. Sherryll BurgerShah. 4. Followup 3 months and as needed.      Relevant Orders   Comprehensive metabolic panel   CBC with Differential/Platelet   Levetiracetam level   Failure to thrive in adult    Patient appears to have had some weight gain since last visit.  Remains malnourished with mild cachexia.  Encourage patient and family to do simple exercises daily to lift leg off chair and extend lower leg to straighten knee while seated and bring back to bent, resting state.  Continue regular oral intake of food.  Consider Boost/Ensure supplement if tolerated.  Encourage adequate protein intake. Labs today.  Evaluate deficiency anemia, electrolyte abnormalities. Followup 3 months.      Relevant Orders   Comprehensive metabolic panel   CBC with Differential/Platelet      Meds ordered this encounter  Medications  . amLODipine (NORVASC) 10 MG tablet    Sig: Take 1 tablet (10 mg total) by mouth daily.    Dispense:  90 tablet    Refill:  1    Order Specific Question:   Supervising Provider    Answer:   Smitty CordsKARAMALEGOS, ALEXANDER J [[id number]]     Follow up plan: Return in about 3 months (around 09/29/2018) for hypertension AND seizures.  Wilhelmina McardleLauren Nitin Mckowen, DNP, AGPCNP-BC Adult Gerontology Primary Care Nurse Practitioner [facility] 06/29/2018, 5:43 PM

## 2018-06-29 NOTE — Assessment & Plan Note (Signed)
Currently improved, but remains above goal and uncontrolled.  BP goal < 140/90 due to medical frailty in elder adult.  Pt is not working on lifestyle modifications.  Taking medications tolerating well without side effects. No currently identified complications, but no renal function check to date.  Plan: 1. Increase amlodipine to 10 mg once daily.  2. Obtain labs CMP, CBC, keppra level today  3. Encouraged heart healthy diet and increasing exercise to 30 minutes most days of the week. 4. Check BP 1-2 x per week at home, keep log, and bring to clinic at next appointment. 5. Follow up 3 months.

## 2018-06-29 NOTE — Assessment & Plan Note (Signed)
Likely improved on Keppra with evidence of fewer post-ictal periods with fewer daytime naps.  Likely, patient was experiencing absence seizures, which have reduced in frequency.  Cognitive ability still lags, but patient is now gaining some weight and appears to be mildly stronger on exam today.  Plan: 1. Continue Keppra 2. Lab to draw Keppra level today or tomorrow. 3. Continue with Neurology appointment as scheduled with Dr. Sherryll BurgerShah. 4. Followup 3 months and as needed.

## 2018-06-30 ENCOUNTER — Other Ambulatory Visit: Payer: PRIVATE HEALTH INSURANCE

## 2018-07-04 LAB — CBC WITH DIFFERENTIAL/PLATELET
Basophils Absolute: 0 cells/uL (ref 0–200)
Basophils Relative: 0 %
Eosinophils Absolute: 8 cells/uL — ABNORMAL LOW (ref 15–500)
Eosinophils Relative: 0.2 %
HCT: 24.3 % — ABNORMAL LOW (ref 35.0–45.0)
Hemoglobin: 7.7 g/dL — ABNORMAL LOW (ref 11.7–15.5)
Lymphs Abs: 1428 cells/uL (ref 850–3900)
MCH: 28.8 pg (ref 27.0–33.0)
MCHC: 31.7 g/dL — ABNORMAL LOW (ref 32.0–36.0)
MCV: 91 fL (ref 80.0–100.0)
MPV: 11.6 fL (ref 7.5–12.5)
Monocytes Relative: 7.2 %
Neutro Abs: 2276 cells/uL (ref 1500–7800)
Neutrophils Relative %: 56.9 %
Platelets: 241 10*3/uL (ref 140–400)
RBC: 2.67 10*6/uL — ABNORMAL LOW (ref 3.80–5.10)
RDW: 11.8 % (ref 11.0–15.0)
Total Lymphocyte: 35.7 %
WBC mixed population: 288 cells/uL (ref 200–950)
WBC: 4 10*3/uL (ref 3.8–10.8)

## 2018-07-04 LAB — COMPREHENSIVE METABOLIC PANEL
AG Ratio: 0.9 (calc) — ABNORMAL LOW (ref 1.0–2.5)
ALT: 7 U/L (ref 6–29)
AST: 15 U/L (ref 10–35)
Albumin: 3.7 g/dL (ref 3.6–5.1)
Alkaline phosphatase (APISO): 59 U/L (ref 33–130)
BUN: 16 mg/dL (ref 7–25)
CO2: 26 mmol/L (ref 20–32)
Calcium: 9.4 mg/dL (ref 8.6–10.4)
Chloride: 106 mmol/L (ref 98–110)
Creat: 0.85 mg/dL (ref 0.60–0.93)
Globulin: 3.9 g/dL (calc) — ABNORMAL HIGH (ref 1.9–3.7)
Glucose, Bld: 112 mg/dL (ref 65–139)
Potassium: 3.7 mmol/L (ref 3.5–5.3)
Sodium: 140 mmol/L (ref 135–146)
Total Bilirubin: 0.6 mg/dL (ref 0.2–1.2)
Total Protein: 7.6 g/dL (ref 6.1–8.1)

## 2018-07-04 LAB — LEVETIRACETAM LEVEL: Keppra (Levetiracetam): 30.3 ug/mL (ref 12.0–46.0)

## 2018-07-17 ENCOUNTER — Ambulatory Visit: Payer: PRIVATE HEALTH INSURANCE | Admitting: Nurse Practitioner

## 2018-10-01 ENCOUNTER — Other Ambulatory Visit: Payer: Self-pay

## 2018-10-01 ENCOUNTER — Ambulatory Visit (INDEPENDENT_AMBULATORY_CARE_PROVIDER_SITE_OTHER): Payer: PRIVATE HEALTH INSURANCE | Admitting: Nurse Practitioner

## 2018-10-01 ENCOUNTER — Encounter: Payer: Self-pay | Admitting: Nurse Practitioner

## 2018-10-01 VITALS — BP 128/82 | HR 76 | Temp 98.8°F

## 2018-10-01 DIAGNOSIS — E440 Moderate protein-calorie malnutrition: Secondary | ICD-10-CM

## 2018-10-01 DIAGNOSIS — R569 Unspecified convulsions: Secondary | ICD-10-CM

## 2018-10-01 DIAGNOSIS — I10 Essential (primary) hypertension: Secondary | ICD-10-CM | POA: Diagnosis not present

## 2018-10-01 DIAGNOSIS — R627 Adult failure to thrive: Secondary | ICD-10-CM | POA: Diagnosis not present

## 2018-10-01 DIAGNOSIS — K5901 Slow transit constipation: Secondary | ICD-10-CM | POA: Diagnosis not present

## 2018-10-01 DIAGNOSIS — R531 Weakness: Secondary | ICD-10-CM

## 2018-10-01 NOTE — Progress Notes (Signed)
Subjective:    Patient ID: Lacey Ray, female    DOB: 1941-10-10, 77 y.o.   MRN: [id number]  Lacey Ray is a 77 y.o. female presenting on 10/01/2018 for Hypertension; Constipation (pt daughter reports shes drinking prune juice to help with the constipation); and Sleeping Problem (pt sleeping alot more. Her daughter think its related to medication.)   HPI Hypertension - She is not checking BP at home or outside of clinic.    - Current medications: amlodipine 10 mg once daily, tolerating well without side effects - She is not currently symptomatic. - Pt denies headache, lightheadedness, dizziness, changes in vision, chest tightness/pressure, palpitations, leg swelling, sudden loss of speech or loss of consciousness. - Her diet is moderate in salt, moderate in fat, and moderate in carbohydrates.   Constipation Is drinking prune juice for BM.  Has BM about 1 x a week, sometimes 1 every 2 weeks. - Has not taken any OTC medications to date.  No significant abdominal pain/distention, but patient mentions she is frequently bloated.  Sleeping too much Patient's daughter notes more sleeping since being on Keppra.  Patient is keeping regular interaction and having much less absent-mindedness than prior to starting Keppra. - Had very mild seizure with eye twitch yesterday and was first seizure since first visit here, which is a lot better than in past.  Protein calorie malnutrition/decompensated - Continues to eat well/3 meals per day. - Says upright in a chair frequently throughout the day, but does lie down to rest frequently. Continues to have complete reliance on family for transfers and mobility.  Social History   Tobacco Use  . Smoking status: Never Smoker  . Smokeless tobacco: Never Used  Substance Use Topics  . Alcohol use: Never    Frequency: Never  . Drug use: Never    Review of Systems Per HPI unless specifically indicated above     Objective:    BP 128/82 (BP  Location: Right Arm, Patient Position: Sitting, Cuff Size: Normal)   Pulse 76   Temp 98.8 F (37.1 C) (Oral)   Wt Readings from Last 3 Encounters:  03/23/18 115 lb (52.2 kg)    Physical Exam  Constitutional: She appears well-developed. She appears lethargic. She appears cachectic. She is cooperative. She is easily aroused. No distress.  HENT:  Head: Normocephalic and atraumatic.  Cardiovascular: Normal rate, regular rhythm, S1 normal, S2 normal, normal heart sounds and intact distal pulses.  Pulmonary/Chest: Effort normal and breath sounds normal. No respiratory distress.  Abdominal: Soft. Bowel sounds are normal. She exhibits no distension and no mass. There is no tenderness. There is no rebound and no guarding. No hernia.  Neurological: She is easily aroused. She appears lethargic. She is disoriented (can state her name only,  Disoriented to time, place, situation). She displays atrophy. She exhibits abnormal muscle tone (very weak with strength of all extremities 2/5). Gait (pt is non-ambulatory 2/2 decreased strength) abnormal. GCS eye subscore is 4. GCS verbal subscore is 5. GCS motor subscore is 6.  Unable to complete full CN exam.  No limitations of CN 2-10.  Patient was unable to follow instructions to complete CN 11-12.  Skin: Skin is warm and dry. Capillary refill takes less than 2 seconds.  Psychiatric: She has a normal mood and affect. Her speech is delayed. She is slowed and withdrawn. Cognition and memory are impaired. She expresses inappropriate judgment. She does not express impulsivity. She is attentive.  Vitals reviewed.   Results for  orders placed or performed in visit on 06/29/18  Comprehensive metabolic panel  Result Value Ref Range   Glucose, Bld 112 65 - 139 mg/dL   BUN 16 7 - 25 mg/dL   Creat 1.610.85 0.960.60 - 0.450.93 mg/dL   BUN/Creatinine Ratio NOT APPLICABLE 6 - 22 (calc)   Sodium 140 135 - 146 mmol/L   Potassium 3.7 3.5 - 5.3 mmol/L   Chloride 106 98 - 110 mmol/L    CO2 26 20 - 32 mmol/L   Calcium 9.4 8.6 - 10.4 mg/dL   Total Protein 7.6 6.1 - 8.1 g/dL   Albumin 3.7 3.6 - 5.1 g/dL   Globulin 3.9 (H) 1.9 - 3.7 g/dL (calc)   AG Ratio 0.9 (L) 1.0 - 2.5 (calc)   Total Bilirubin 0.6 0.2 - 1.2 mg/dL   Alkaline phosphatase (APISO) 59 33 - 130 U/L   AST 15 10 - 35 U/L   ALT 7 6 - 29 U/L  CBC with Differential/Platelet  Result Value Ref Range   WBC 4.0 3.8 - 10.8 Thousand/uL   RBC 2.67 (L) 3.80 - 5.10 Million/uL   Hemoglobin 7.7 (L) 11.7 - 15.5 g/dL   HCT 40.924.3 (L) 81.135.0 - 91.445.0 %   MCV 91.0 80.0 - 100.0 fL   MCH 28.8 27.0 - 33.0 pg   MCHC 31.7 (L) 32.0 - 36.0 g/dL   RDW 78.211.8 95.611.0 - 21.315.0 %   Platelets 241 140 - 400 Thousand/uL   MPV 11.6 7.5 - 12.5 fL   Neutro Abs 2,276 1,500 - 7,800 cells/uL   Lymphs Abs 1,428 850 - 3,900 cells/uL   WBC mixed population 288 200 - 950 cells/uL   Eosinophils Absolute 8 (L) 15 - 500 cells/uL   Basophils Absolute 0 0 - 200 cells/uL   Neutrophils Relative % 56.9 %   Total Lymphocyte 35.7 %   Monocytes Relative 7.2 %   Eosinophils Relative 0.2 %   Basophils Relative 0.0 %  Levetiracetam level  Result Value Ref Range   Keppra (Levetiracetam) 30.3 12.0 - 46.0 mcg/mL      Assessment & Plan:   Problem List Items Addressed This Visit      Cardiovascular and Mediastinum   Essential hypertension Stable today on exam.  Medications tolerated without side effects.  Continue at current doses.  Refills provided.  Check labs in 3 mos. Followup 3 months.      Other   Seizure (HCC) New drowsiness family associated with keppra.  Seizure activity is significantly reduced and is better benefit than risk of increased drowsiness.  After discussing risks and benefits, family agrees to continue.  May discuss with neurology at future visit.  Follow-up prn.    Generalized weakness   Failure to thrive in adult Moderate protein-calorie malnutrition Patient with continued decompensation.  Discussed physical activity with family and need  to have posture activation and engagement of legs while they are on floor.  Verbalize udnerstanding.  Continue feeding patient 3 regular meals and 2 snacks daily.  Focus on protein intake at all meals and snacks.    Other Visit Diagnoses    Slow transit constipation    -  Primary Constipation likely complicated by poor mobility.  Likely also a low-fiber diet and poor water intake.  Plan: 1. Continue prune juice 2. May start miralax 1 dose daily if no BM 2-3 days. 3. May also use 1-2 tabs dulcolax OTC if no BM in 3 days. 4. Follow-up prn. Consider future GI referral prn.  Follow up plan: Return in about 3 months (around 01/01/2019) for hypertension.  Wilhelmina Mcardle, DNP, AGPCNP-BC Adult Gerontology Primary Care Nurse Practitioner [facility] 10/01/2018, 1:38 PM

## 2018-10-01 NOTE — Patient Instructions (Addendum)
Ernesto Rutherfordose M Duerson,   Thank you for coming in to clinic today.  1. For constipation: - Continue prune juice - START miralax 1 dose daily as needed if no BM in 2-3 days. - If no BM in 3 days, may give 1-2 tablet dulcolax over the counter.  2. Continue amlodipine 10 mg once daily for blood pressure.  3. For drowsiness - we need to consider risks of having more seizures.  If you continue to have concerns about drowsiness, please talk with Neurology. - Continue Keppra 500 mg twice daily.  Please schedule a follow-up appointment with Wilhelmina McardleLauren Arline Ketter, AGNP. Return in about 3 months (around 01/01/2019) for hypertension.  If you have any other questions or concerns, please feel free to call the clinic or send a message through MyChart. You may also schedule an earlier appointment if necessary.  You will receive a survey after today's visit either digitally by e-mail or paper by [employer] mail. Your experiences and feedback matter to us.  Please respond so we know how we are doing as we provide care for you.   Wilhelmina McardleLauren Alwyn Cordner, DNP, AGNP-BC Adult Gerontology Nurse Practitioner [facility]

## 2018-10-08 ENCOUNTER — Encounter: Payer: Self-pay | Admitting: Nurse Practitioner

## 2019-01-01 ENCOUNTER — Ambulatory Visit: Payer: PRIVATE HEALTH INSURANCE | Admitting: Nurse Practitioner

## 2019-01-12 ENCOUNTER — Telehealth: Payer: Self-pay | Admitting: Nurse Practitioner

## 2019-01-12 DIAGNOSIS — I10 Essential (primary) hypertension: Secondary | ICD-10-CM

## 2019-01-12 MED ORDER — AMLODIPINE BESYLATE 10 MG PO TABS: 10.0000 mg | ORAL_TABLET | Freq: Every day | ORAL | 1 refills | Status: DC

## 2019-01-12 NOTE — Telephone Encounter (Signed)
[facility]  Daughter's call back [phone]

## 2019-01-14 ENCOUNTER — Other Ambulatory Visit: Payer: Self-pay

## 2019-01-14 ENCOUNTER — Telehealth: Payer: Self-pay | Admitting: Nurse Practitioner

## 2019-01-14 DIAGNOSIS — R569 Unspecified convulsions: Secondary | ICD-10-CM

## 2019-01-14 MED ORDER — LEVETIRACETAM 500 MG PO TABS: 500.0000 mg | ORAL_TABLET | Freq: Two times a day (BID) | ORAL | 0 refills | Status: DC

## 2019-01-29 NOTE — Telephone Encounter (Signed)
error 

## 2019-03-02 ENCOUNTER — Other Ambulatory Visit: Payer: Self-pay | Admitting: Nurse Practitioner

## 2019-03-02 DIAGNOSIS — R569 Unspecified convulsions: Secondary | ICD-10-CM

## 2019-03-02 MED ORDER — LEVETIRACETAM 500 MG PO TABS: 500.0000 mg | ORAL_TABLET | Freq: Two times a day (BID) | ORAL | 0 refills | Status: DC

## 2019-03-02 NOTE — Telephone Encounter (Signed)
Pt  daughter called requesting refill on amlodipine,levetiracetam 500 mg  [facility]

## 2019-03-02 NOTE — Telephone Encounter (Signed)
Pt requesting  

## 2019-03-03 ENCOUNTER — Other Ambulatory Visit: Payer: Self-pay

## 2019-03-03 ENCOUNTER — Ambulatory Visit (INDEPENDENT_AMBULATORY_CARE_PROVIDER_SITE_OTHER): Payer: PRIVATE HEALTH INSURANCE | Admitting: Nurse Practitioner

## 2019-03-03 ENCOUNTER — Encounter: Payer: Self-pay | Admitting: Nurse Practitioner

## 2019-03-03 DIAGNOSIS — I10 Essential (primary) hypertension: Secondary | ICD-10-CM

## 2019-03-03 DIAGNOSIS — R569 Unspecified convulsions: Secondary | ICD-10-CM

## 2019-03-03 MED ORDER — AMLODIPINE BESYLATE 10 MG PO TABS: 10.0000 mg | ORAL_TABLET | Freq: Every day | ORAL | 1 refills | Status: DC

## 2019-03-03 MED ORDER — LEVETIRACETAM 500 MG PO TABS: 500.0000 mg | ORAL_TABLET | Freq: Two times a day (BID) | ORAL | 1 refills | Status: DC

## 2019-03-03 NOTE — Progress Notes (Signed)
Telemedicine Encounter: Disclosed to patient's daughter and primary caregiver at start of encounter that we will provide appropriate telemedicine services and charges.  Patient's daughter consents for patient to be treated via phone prior to discussion. - Patient and patient's daughter are at home and are accessed via telephone. - Services are provided by Wilhelmina Mcardle from [facility].  Subjective:    Patient ID: Lacey Ray, female    DOB: 19-Jun-1941, 78 y.o.   MRN: [id number]  Lacey Ray is a 78 y.o. female presenting on 03/03/2019 for Seizures and Hypertension (pt daughter states she have not checked her mother blood pressure lately)  HPI HPI entirely obtained by Genia Harold, patient's daughter  Hypertension - She is not checking BP at home or outside of clinic.    - Current medications: amlodipine 10 mg one tablet daily, tolerating well without side effects - She is not currently symptomatic. - Pt denies headache, lightheadedness, dizziness, changes in vision, chest tightness/pressure, palpitations, leg swelling, sudden loss of speech or loss of consciousness. - She  reports no regular exercise routine as patient is not fully weight bearing and continues to require the assistance of family for mobility (nearly total care). - Her diet is moderate in salt, moderate in fat, and moderate in carbohydrates.   Seizures Patient continues to stay on Keppra. Patient has occasional absence seizure episodes 1-2 times per week. Patient is usually transiently alert, but is reported to have had a very good day and was "very alert yesterday."  In general she is continuing an improved QOL since starting Keppra. - Patient's strength is about the same.  Has used back support to help with increasing time out of bed, but was not sufficient for pt and daughter is looking for better back support.   Social History   Tobacco Use  . Smoking status: Never Smoker  . Smokeless  tobacco: Never Used  Substance Use Topics  . Alcohol use: Never    Frequency: Never  . Drug use: Never   Review of Systems Per HPI unless specifically indicated above     Objective:    There were no vitals taken for this visit.  Wt Readings from Last 3 Encounters:  03/23/18 115 lb (52.2 kg)    Physical Exam Patient remotely monitored. Patient is unable to interact verbally today.    Results for orders placed or performed in visit on 06/29/18  Comprehensive metabolic panel  Result Value Ref Range   Glucose, Bld 112 65 - 139 mg/dL   BUN 16 7 - 25 mg/dL   Creat 2.02 5.42 - 7.06 mg/dL   BUN/Creatinine Ratio NOT APPLICABLE 6 - 22 (calc)   Sodium 140 135 - 146 mmol/L   Potassium 3.7 3.5 - 5.3 mmol/L   Chloride 106 98 - 110 mmol/L   CO2 26 20 - 32 mmol/L   Calcium 9.4 8.6 - 10.4 mg/dL   Total Protein 7.6 6.1 - 8.1 g/dL   Albumin 3.7 3.6 - 5.1 g/dL   Globulin 3.9 (H) 1.9 - 3.7 g/dL (calc)   AG Ratio 0.9 (L) 1.0 - 2.5 (calc)   Total Bilirubin 0.6 0.2 - 1.2 mg/dL   Alkaline phosphatase (APISO) 59 33 - 130 U/L   AST 15 10 - 35 U/L   ALT 7 6 - 29 U/L  CBC with Differential/Platelet  Result Value Ref Range   WBC 4.0 3.8 - 10.8 Thousand/uL   RBC 2.67 (L) 3.80 - 5.10 Million/uL  Hemoglobin 7.7 (L) 11.7 - 15.5 g/dL   HCT 40.924.3 (L) 81.135.0 - 91.445.0 %   MCV 91.0 80.0 - 100.0 fL   MCH 28.8 27.0 - 33.0 pg   MCHC 31.7 (L) 32.0 - 36.0 g/dL   RDW 78.211.8 95.611.0 - 21.315.0 %   Platelets 241 140 - 400 Thousand/uL   MPV 11.6 7.5 - 12.5 fL   Neutro Abs 2,276 1,500 - 7,800 cells/uL   Lymphs Abs 1,428 850 - 3,900 cells/uL   WBC mixed population 288 200 - 950 cells/uL   Eosinophils Absolute 8 (L) 15 - 500 cells/uL   Basophils Absolute 0 0 - 200 cells/uL   Neutrophils Relative % 56.9 %   Total Lymphocyte 35.7 %   Monocytes Relative 7.2 %   Eosinophils Relative 0.2 %   Basophils Relative 0.0 %  Levetiracetam level  Result Value Ref Range   Keppra (Levetiracetam) 30.3 12.0 - 46.0 mcg/mL       Assessment & Plan:   Problem List Items Addressed This Visit      Cardiovascular and Mediastinum   Essential hypertension Stable today on evaluation.  Encouraged home BP checks occasionally with BP goal < 140/90 due to medical frailty.  Medications tolerated without side effects.  Continue at current doses.  Refills provided.  Check labs at next appointment. Followup 6 months.    Relevant Medications   amLODipine (NORVASC) 10 MG tablet     Other   Seizure (HCC) Stable today on evaluation, but with continued seizure activity about 1-2 times per week. Significantly improved from my initial evaluation with patient and presumed seizure activity nearly constantly throughout the day.  Medications continue to be tolerated without side effects.  Continue at current doses.  Refills provided.  Check labs at next appointment for levetiracetam levels. Followup 6 months.    Relevant Medications   levETIRAcetam (KEPPRA) 500 MG tablet      Meds ordered this encounter  Medications  . amLODipine (NORVASC) 10 MG tablet    Sig: Take 1 tablet (10 mg total) by mouth daily.    Dispense:  90 tablet    Refill:  1    Order Specific Question:   Supervising Provider    Answer:   Smitty CordsKARAMALEGOS, ALEXANDER J [[id number]]  . levETIRAcetam (KEPPRA) 500 MG tablet    Sig: Take 1 tablet (500 mg total) by mouth 2 (two) times daily.    Dispense:  180 tablet    Refill:  1    Order Specific Question:   Supervising Provider    Answer:   Smitty CordsKARAMALEGOS, ALEXANDER J [[id number]]   - Time spent in direct consultation with patient via telemedicine about above concerns: 7 minutes  Follow up plan: Return in about 6 months (around 09/02/2019) for seizure disorder, hypertension.   Wilhelmina McardleLauren Khiree Bukhari, DNP, AGPCNP-BC Adult Gerontology Primary Care Nurse Practitioner [facility] 03/03/2019, 9:49 AM

## 2019-06-07 ENCOUNTER — Telehealth: Payer: Self-pay

## 2019-06-07 NOTE — Telephone Encounter (Signed)
She will need an appointment and possibly a referral or repeat visit with neurology.  If the patient's daughter prefers, they can also call neurology for advice prior to an appointment.  Pt has a seizure disorder and sleepiness could be a sign that the patient is having more seizures.

## 2019-06-07 NOTE — Telephone Encounter (Signed)
The pt daughter was notified of Lauren recommendation. She is going to give the Neurologist a call first.

## 2019-06-07 NOTE — Telephone Encounter (Signed)
The pt daughter called with concerns that her mother could be having some side effects to her medications. She complains that her mother is sleeping a lot through out the day. She gets up in the morning around 9am and about 8 hrs later she goes back to sleep again for several hours. She complains that she hardly sleep at all through out the night. Please advise

## 2019-11-03 ENCOUNTER — Other Ambulatory Visit: Payer: Self-pay | Admitting: Nurse Practitioner

## 2019-11-03 DIAGNOSIS — R569 Unspecified convulsions: Secondary | ICD-10-CM

## 2019-12-21 ENCOUNTER — Other Ambulatory Visit: Payer: Self-pay | Admitting: Nurse Practitioner

## 2019-12-21 DIAGNOSIS — I10 Essential (primary) hypertension: Secondary | ICD-10-CM

## 2019-12-21 MED ORDER — AMLODIPINE BESYLATE 10 MG PO TABS: 10.0000 mg | ORAL_TABLET | Freq: Every day | ORAL | 0 refills | Status: DC

## 2019-12-21 NOTE — Telephone Encounter (Signed)
Pt called requesting refill on BP medication °

## 2019-12-22 ENCOUNTER — Other Ambulatory Visit: Payer: Self-pay | Admitting: Nurse Practitioner

## 2019-12-22 DIAGNOSIS — I10 Essential (primary) hypertension: Secondary | ICD-10-CM

## 2020-01-05 ENCOUNTER — Other Ambulatory Visit: Payer: Self-pay

## 2020-01-05 ENCOUNTER — Encounter: Payer: Self-pay | Admitting: Family Medicine

## 2020-01-05 ENCOUNTER — Ambulatory Visit (INDEPENDENT_AMBULATORY_CARE_PROVIDER_SITE_OTHER): Payer: Self-pay | Admitting: Family Medicine

## 2020-01-05 DIAGNOSIS — I10 Essential (primary) hypertension: Secondary | ICD-10-CM | POA: Insufficient documentation

## 2020-01-05 DIAGNOSIS — R569 Unspecified convulsions: Secondary | ICD-10-CM

## 2020-01-05 MED ORDER — AMLODIPINE BESYLATE 10 MG PO TABS: 10.0000 mg | ORAL_TABLET | Freq: Every day | ORAL | 0 refills | Status: DC

## 2020-01-05 MED ORDER — LEVETIRACETAM 500 MG PO TABS: 500.0000 mg | ORAL_TABLET | Freq: Two times a day (BID) | ORAL | 0 refills | Status: AC

## 2020-01-05 NOTE — Assessment & Plan Note (Signed)
Has established with Dr. Sherryll Burger for Neurological care.  Will refill Keppra today and will have follow up medications refilled by Dr. Sherryll Burger.

## 2020-01-05 NOTE — Progress Notes (Signed)
I have reviewed this encounter including the documentation in this note and/or discussed this patient with the provider, Danielle Rankin FNP. I am certifying that I agree with the content of this note as supervising physician.  Saralyn Pilar, DO [facility] 01/05/2020, 1:04 PM

## 2020-01-05 NOTE — Patient Instructions (Addendum)
To continue Amlodipine and Keppra as directed.  Keep follow up appointment with Dr. Sherryll Burger with Neurology.  We will see you back in clinic in 6 months and will have labs drawn at that time.  Continue to keep a blood pressure log and bring to clinic at your next visit.  Call us with any questions/concerns/needs

## 2020-01-05 NOTE — Assessment & Plan Note (Signed)
Will continue on Amlodipine and have labs drawn in 6 months for re-evaluation.  Daughter will continue to take blood pressure readings and to contact office if greater than 140/90 to discuss changes in medications.  Not currently working on lifestyle modifications.  Taking medications and tolerating well without side effects.  Plan: 1) Continue amlodipine as directed 2) Labs to be drawn before next visit in 6 months. 3) To continue to follow with Neurology for Keppra refills and seizures, will refill Keppra today until appointment is secured with Neurology.

## 2020-01-05 NOTE — Progress Notes (Addendum)
Virtual Visit via Telephone The purpose of this virtual visit is to provide medical care while limiting exposure to the novel coronavirus (COVID19) for both patient and office staff.  Consent was obtained for phone visit:  Yes.   Answered questions that patient had about telehealth interaction:  Yes.   I discussed the limitations, risks, security and privacy concerns of performing an evaluation and management service by telephone. I also discussed with the patient that there may be a patient responsible charge related to this service. The patient expressed understanding and agreed to proceed.  Patient Location: Home Provider Location: [facility])  ---------------------------------------------------------------------- Chief Complaint  Patient presents with  . Hypertension    the patient is nonverbal. The visit is conducted through the patient daughter Johnanna Schneiders.    S: Reviewed CMA documentation. I have called patient and gathered additional HPI as follows:  Medication refill on hypertension medications  Patient is currently at home. Denies any high risk travel to areas of current concern for COVID19. Denies any known or suspected exposure to person with or possibly with COVID19.  Admits no complaints, except for medication refill needed for Amlodipine and Keppra.  Denies any fevers, chills, sweats, body ache, cough, shortness of breath, sinus pain or pressure, headache, abdominal pain, diarrhea  Past Medical History:  Diagnosis Date  . Hypertension   . Seizures ([place])    Social History   Tobacco Use  . Smoking status: Never Smoker  . Smokeless tobacco: Never Used  Substance Use Topics  . Alcohol use: Never  . Drug use: Never    Current Outpatient Medications:  .  amLODipine (NORVASC) 10 MG tablet, Take 1 tablet (10 mg total) by mouth daily., Disp: 90 tablet, Rfl: 0 .  levETIRAcetam (KEPPRA) 500 MG tablet, Take 1 tablet (500 mg total) by mouth 2  (two) times daily., Disp: 360 tablet, Rfl: 0  Depression screen PHQ 2/9 01/05/2020  Decreased Interest 0  Down, Depressed, Hopeless 1  PHQ - 2 Score 1    No flowsheet data found.  -------------------------------------------------------------------------- O: No physical exam performed due to remote telephone encounter.  -------------------------------------------------------------------------- A&P:  Problem List Items Addressed This Visit    Seizure [facility])    Has established with Dr. Manuella Ghazi for Neurological care.  Will refill Keppra today and will have follow up medications refilled by Dr. Manuella Ghazi.      Relevant Medications   levETIRAcetam (KEPPRA) 500 MG tablet   Essential hypertension    Will continue on Amlodipine and have labs drawn in 6 months for re-evaluation.  Daughter will continue to take blood pressure readings and to contact office if greater than 140/90 to discuss changes in medications.  Not currently working on lifestyle modifications.  Taking medications and tolerating well without side effects.  Plan: 1) Continue amlodipine as directed 2) Labs to be drawn before next visit in 6 months. 3) To continue to follow with Neurology for Keppra refills and seizures, will refill Keppra today until appointment is secured with Neurology.      Relevant Medications   amLODipine (NORVASC) 10 MG tablet   Other Relevant Orders   CBC with Differential   Comprehensive Metabolic Panel (CMET)   Microalbumin, urine      Meds ordered this encounter  Medications  . amLODipine (NORVASC) 10 MG tablet    Sig: Take 1 tablet (10 mg total) by mouth daily.    Dispense:  90 tablet    Refill:  0    Order Specific  Question:   Supervising Provider    Answer:   Smitty Cords [[id number]]  . levETIRAcetam (KEPPRA) 500 MG tablet    Sig: Take 1 tablet (500 mg total) by mouth 2 (two) times daily.    Dispense:  360 tablet    Refill:  0    Order Specific Question:   Supervising  Provider    Answer:   Smitty Cords [[id number]]    Follow-up: - Return in 6 months for hypertension follow up visit - Future labs ordered for next clinic visit.  Patient is non-verbal, all appointments completed with daughter, Regan Rakers.  Sherell verbalizes understanding with the above medical recommendations including the limitation of remote medical advice.  Specific follow-up and call-back criteria were given for patient to follow-up or seek medical care more urgently if needed.  - Time spent in direct consultation with patient on phone: 7 minutes  Charlaine Dalton, FNP-C [facility] 01/05/2020, 10:53 AM

## 2020-03-27 ENCOUNTER — Other Ambulatory Visit: Payer: Self-pay | Admitting: Family Medicine

## 2020-03-27 DIAGNOSIS — I10 Essential (primary) hypertension: Secondary | ICD-10-CM

## 2020-04-05 ENCOUNTER — Other Ambulatory Visit: Payer: Self-pay

## 2020-04-05 ENCOUNTER — Ambulatory Visit (INDEPENDENT_AMBULATORY_CARE_PROVIDER_SITE_OTHER): Payer: PRIVATE HEALTH INSURANCE | Admitting: Family Medicine

## 2020-04-05 ENCOUNTER — Encounter: Payer: Self-pay | Admitting: Family Medicine

## 2020-04-05 ENCOUNTER — Ambulatory Visit: Payer: PRIVATE HEALTH INSURANCE | Admitting: Family Medicine

## 2020-04-05 VITALS — BP 130/47 | HR 81 | Temp 97.5°F

## 2020-04-05 DIAGNOSIS — K602 Anal fissure, unspecified: Secondary | ICD-10-CM | POA: Diagnosis not present

## 2020-04-05 MED ORDER — LIDOCAINE-PRILOCAINE 2.5-2.5 % EX CREA: 1.0000 "application " | TOPICAL_CREAM | CUTANEOUS | 1 refills | Status: AC | PRN

## 2020-04-05 NOTE — Progress Notes (Signed)
Subjective:    Patient ID: Lacey Ray, female    DOB: 1941-01-13, 79 y.o.   MRN: [id number]  Lacey Ray is a 79 y.o. female presenting on 04/05/2020 for Rectal Pain (rectal tear possible from constipation x 1 week )   HPI  Lacey Ray presents to clinic with caregiver.  Has concerns of having rectal pain with possible tear from constipation x 1 week.  Since passing hard stools has now started metamucil daily with softening of stools.  Denies any blood in stool or rectal bleeding.  Denies dizziness, altered mental status from baseline, or other acute concerns today.  Depression screen PHQ 2/9 01/05/2020  Decreased Interest 0  Down, Depressed, Hopeless 1  PHQ - 2 Score 1    Social History   Tobacco Use  . Smoking status: Never Smoker  . Smokeless tobacco: Never Used  Substance Use Topics  . Alcohol use: Never  . Drug use: Never    Review of Systems  Constitutional: Negative.   HENT: Negative.   Eyes: Negative.   Respiratory: Negative.   Cardiovascular: Negative.   Gastrointestinal: Positive for rectal pain. Negative for abdominal distention, abdominal pain, anal bleeding, blood in stool, constipation, diarrhea, nausea and vomiting.  Endocrine: Negative.   Genitourinary: Negative.   Musculoskeletal: Negative.   Skin: Negative.   Allergic/Immunologic: Negative.   Neurological: Negative.   Hematological: Negative.   Psychiatric/Behavioral: Negative.    Per HPI unless specifically indicated above     Objective:    BP (!) 130/47 (BP Location: Right Arm, Patient Position: Sitting, Cuff Size: Normal)   Pulse 81   Temp (!) 97.5 F (36.4 C) (Temporal)   Wt Readings from Last 3 Encounters:  03/23/18 115 lb (52.2 kg)    Physical Exam Vitals reviewed.  Constitutional:      General: She is not in acute distress.    Appearance: Normal appearance. She is well-groomed and normal weight. She is not ill-appearing or toxic-appearing.  HENT:     Head: Normocephalic.    Eyes:     General:        Right eye: No discharge.        Left eye: No discharge.     Conjunctiva/sclera: Conjunctivae normal.     Pupils: Pupils are equal, round, and reactive to light.  Cardiovascular:     Pulses: Normal pulses.  Pulmonary:     Effort: Pulmonary effort is normal.  Abdominal:     General: Abdomen is flat. There is no distension.     Palpations: Abdomen is soft.     Tenderness: There is no abdominal tenderness. There is no guarding.  Genitourinary:    Rectum: Anal fissure present. No mass, tenderness, external hemorrhoid or internal hemorrhoid. Normal anal tone.  Musculoskeletal:     Right lower leg: No edema.     Left lower leg: No edema.  Skin:    General: Skin is warm and dry.     Capillary Refill: Capillary refill takes less than 2 seconds.  Neurological:     Mental Status: Mental status is at baseline.     GCS: GCS eye subscore is 4. GCS verbal subscore is 2. GCS motor subscore is 6.     Comments: Paraplegic, non verbal during appointment.  Caregiver reports is at her baseline.  Psychiatric:        Behavior: Behavior is cooperative.     Results for orders placed or performed in visit on 06/29/18  Comprehensive metabolic panel  Result Value Ref Range   Glucose, Bld 112 65 - 139 mg/dL   BUN 16 7 - 25 mg/dL   Creat 3.08 6.57 - 8.46 mg/dL   BUN/Creatinine Ratio NOT APPLICABLE 6 - 22 (calc)   Sodium 140 135 - 146 mmol/L   Potassium 3.7 3.5 - 5.3 mmol/L   Chloride 106 98 - 110 mmol/L   CO2 26 20 - 32 mmol/L   Calcium 9.4 8.6 - 10.4 mg/dL   Total Protein 7.6 6.1 - 8.1 g/dL   Albumin 3.7 3.6 - 5.1 g/dL   Globulin 3.9 (H) 1.9 - 3.7 g/dL (calc)   AG Ratio 0.9 (L) 1.0 - 2.5 (calc)   Total Bilirubin 0.6 0.2 - 1.2 mg/dL   Alkaline phosphatase (APISO) 59 33 - 130 U/L   AST 15 10 - 35 U/L   ALT 7 6 - 29 U/L  CBC with Differential/Platelet  Result Value Ref Range   WBC 4.0 3.8 - 10.8 Thousand/uL   RBC 2.67 (L) 3.80 - 5.10 Million/uL   Hemoglobin 7.7 (L)  11.7 - 15.5 g/dL   HCT 96.2 (L) 95.2 - 84.1 %   MCV 91.0 80.0 - 100.0 fL   MCH 28.8 27.0 - 33.0 pg   MCHC 31.7 (L) 32.0 - 36.0 g/dL   RDW 32.4 40.1 - 02.7 %   Platelets 241 140 - 400 Thousand/uL   MPV 11.6 7.5 - 12.5 fL   Neutro Abs 2,276 1,500 - 7,800 cells/uL   Lymphs Abs 1,428 850 - 3,900 cells/uL   WBC mixed population 288 200 - 950 cells/uL   Eosinophils Absolute 8 (L) 15 - 500 cells/uL   Basophils Absolute 0 0 - 200 cells/uL   Neutrophils Relative % 56.9 %   Total Lymphocyte 35.7 %   Monocytes Relative 7.2 %   Eosinophils Relative 0.2 %   Basophils Relative 0.0 %  Levetiracetam level  Result Value Ref Range   Keppra (Levetiracetam) 30.3 12.0 - 46.0 mcg/mL      Assessment & Plan:   Problem List Items Addressed This Visit      Digestive   Anal fissure - Primary    Anal fissure x 1, no s/s of infection during exam.  Will rx topical lidocaine 2% cream to apply as needed for rectal pain.  Discussed continued use of Metamucil and increasing fluids to prevent constipation and hard stools.  Plan: 1. Use topical lidocaine 2% cream as needed for rectal pain 2. Continue to increase fluids and use daily metamucil to prevent constipation and hard stools.      Relevant Medications   lidocaine-prilocaine (EMLA) cream      Meds ordered this encounter  Medications  . lidocaine-prilocaine (EMLA) cream    Sig: Apply 1 application topically as needed.    Dispense:  30 g    Refill:  1      Follow up plan: Return if symptoms worsen or fail to improve.   Charlaine Dalton, FNP Family Nurse Practitioner [facility] 04/05/2020, 3:40 PM

## 2020-04-05 NOTE — Assessment & Plan Note (Signed)
Anal fissure x 1, no s/s of infection during exam.  Will rx topical lidocaine 2% cream to apply as needed for rectal pain.  Discussed continued use of Metamucil and increasing fluids to prevent constipation and hard stools.  Plan: 1. Use topical lidocaine 2% cream as needed for rectal pain 2. Continue to increase fluids and use daily metamucil to prevent constipation and hard stools.

## 2020-04-05 NOTE — Patient Instructions (Signed)
As we discussed, you have an anal fissure.  This can be caused by constipation.  Continue your metamucil with plenty of fluids daily to help your stools stay soft.  I have sent in a prescription for topical lidocaine to be applied to your bottom, as needed, for pain.  This should begin to resolve over the next week or so.  If having worsening of pain or begin to get constipated again, to please contact our office for follow up.  You will receive a survey after today's visit either digitally by e-mail or paper by [employer]. Your experiences and feedback matter to [place].  Please respond so we know how we are doing as we provide care for you.  Call us with any questions/concerns/needs.  It is my goal to be available to you for your health concerns.  Thanks for choosing me to be a partner in your healthcare needs!  Charlaine Dalton, FNP-C Family Nurse Practitioner [facility] Phone: [phone]

## 2020-05-16 ENCOUNTER — Inpatient Hospital Stay
Admission: EM | Admit: 2020-05-16 | Discharge: 2020-06-18 | DRG: 871 | Disposition: E | Payer: PRIVATE HEALTH INSURANCE | Attending: Internal Medicine | Admitting: Internal Medicine

## 2020-05-16 ENCOUNTER — Encounter: Payer: Self-pay | Admitting: Emergency Medicine

## 2020-05-16 ENCOUNTER — Inpatient Hospital Stay: Payer: PRIVATE HEALTH INSURANCE

## 2020-05-16 ENCOUNTER — Other Ambulatory Visit: Payer: Self-pay

## 2020-05-16 ENCOUNTER — Emergency Department: Payer: PRIVATE HEALTH INSURANCE

## 2020-05-16 DIAGNOSIS — I10 Essential (primary) hypertension: Secondary | ICD-10-CM | POA: Diagnosis present

## 2020-05-16 DIAGNOSIS — Z66 Do not resuscitate: Secondary | ICD-10-CM | POA: Diagnosis present

## 2020-05-16 DIAGNOSIS — E872 Acidosis, unspecified: Secondary | ICD-10-CM | POA: Diagnosis present

## 2020-05-16 DIAGNOSIS — A419 Sepsis, unspecified organism: Secondary | ICD-10-CM | POA: Diagnosis present

## 2020-05-16 DIAGNOSIS — Z515 Encounter for palliative care: Secondary | ICD-10-CM | POA: Diagnosis not present

## 2020-05-16 DIAGNOSIS — R680 Hypothermia, not associated with low environmental temperature: Secondary | ICD-10-CM | POA: Diagnosis present

## 2020-05-16 DIAGNOSIS — G9341 Metabolic encephalopathy: Secondary | ICD-10-CM | POA: Diagnosis not present

## 2020-05-16 DIAGNOSIS — N390 Urinary tract infection, site not specified: Secondary | ICD-10-CM | POA: Diagnosis present

## 2020-05-16 DIAGNOSIS — R6521 Severe sepsis with septic shock: Secondary | ICD-10-CM | POA: Diagnosis present

## 2020-05-16 DIAGNOSIS — G92 Toxic encephalopathy: Secondary | ICD-10-CM | POA: Diagnosis present

## 2020-05-16 DIAGNOSIS — D649 Anemia, unspecified: Secondary | ICD-10-CM | POA: Diagnosis present

## 2020-05-16 DIAGNOSIS — Z20822 Contact with and (suspected) exposure to covid-19: Secondary | ICD-10-CM | POA: Diagnosis present

## 2020-05-16 DIAGNOSIS — R569 Unspecified convulsions: Secondary | ICD-10-CM

## 2020-05-16 DIAGNOSIS — G9340 Encephalopathy, unspecified: Secondary | ICD-10-CM | POA: Diagnosis not present

## 2020-05-16 DIAGNOSIS — G40909 Epilepsy, unspecified, not intractable, without status epilepticus: Secondary | ICD-10-CM | POA: Diagnosis present

## 2020-05-16 DIAGNOSIS — J9602 Acute respiratory failure with hypercapnia: Secondary | ICD-10-CM | POA: Diagnosis present

## 2020-05-16 DIAGNOSIS — J9601 Acute respiratory failure with hypoxia: Secondary | ICD-10-CM | POA: Diagnosis present

## 2020-05-16 DIAGNOSIS — B9620 Unspecified Escherichia coli [E. coli] as the cause of diseases classified elsewhere: Secondary | ICD-10-CM | POA: Diagnosis present

## 2020-05-16 DIAGNOSIS — Z8673 Personal history of transient ischemic attack (TIA), and cerebral infarction without residual deficits: Secondary | ICD-10-CM | POA: Diagnosis not present

## 2020-05-16 DIAGNOSIS — N170 Acute kidney failure with tubular necrosis: Secondary | ICD-10-CM | POA: Diagnosis present

## 2020-05-16 DIAGNOSIS — Z79899 Other long term (current) drug therapy: Secondary | ICD-10-CM

## 2020-05-16 HISTORY — DX: Cerebral infarction, unspecified: I63.9

## 2020-05-16 LAB — URINALYSIS, COMPLETE (UACMP) WITH MICROSCOPIC
Bilirubin Urine: NEGATIVE
Glucose, UA: NEGATIVE mg/dL
Hgb urine dipstick: NEGATIVE
Ketones, ur: 5 mg/dL — AB
Leukocytes,Ua: NEGATIVE
Nitrite: NEGATIVE
Protein, ur: 30 mg/dL — AB
Specific Gravity, Urine: 1.013 (ref 1.005–1.030)
pH: 6 (ref 5.0–8.0)

## 2020-05-16 LAB — COMPREHENSIVE METABOLIC PANEL
ALT: 24 U/L (ref 0–44)
AST: 50 U/L — ABNORMAL HIGH (ref 15–41)
Albumin: 3.4 g/dL — ABNORMAL LOW (ref 3.5–5.0)
Alkaline Phosphatase: 47 U/L (ref 38–126)
Anion gap: 24 — ABNORMAL HIGH (ref 5–15)
BUN: 30 mg/dL — ABNORMAL HIGH (ref 8–23)
CO2: 8 mmol/L — ABNORMAL LOW (ref 22–32)
Calcium: 9.5 mg/dL (ref 8.9–10.3)
Chloride: 110 mmol/L (ref 98–111)
Creatinine, Ser: 1.83 mg/dL — ABNORMAL HIGH (ref 0.44–1.00)
GFR calc Af Amer: 30 mL/min — ABNORMAL LOW (ref >60)
GFR calc non Af Amer: 26 mL/min — ABNORMAL LOW (ref >60)
Glucose, Bld: 102 mg/dL — ABNORMAL HIGH (ref 70–99)
Potassium: 4.7 mmol/L (ref 3.5–5.1)
Sodium: 142 mmol/L (ref 135–145)
Total Bilirubin: 0.8 mg/dL (ref 0.3–1.2)
Total Protein: 7.9 g/dL (ref 6.5–8.1)

## 2020-05-16 LAB — SARS CORONAVIRUS 2 BY RT PCR (HOSPITAL ORDER, PERFORMED IN ~~LOC~~ HOSPITAL LAB): SARS Coronavirus 2: NEGATIVE

## 2020-05-16 LAB — LACTIC ACID, PLASMA
Lactic Acid, Venous: >11 mmol/L (ref 0.5–1.9)
Lactic Acid, Venous: >11 mmol/L (ref 0.5–1.9)

## 2020-05-16 LAB — CBC
HCT: 10.2 % — ABNORMAL LOW (ref 36.0–46.0)
Hemoglobin: 2.6 g/dL — CL (ref 12.0–15.0)
MCH: 15.1 pg — ABNORMAL LOW (ref 26.0–34.0)
MCHC: 25.5 g/dL — ABNORMAL LOW (ref 30.0–36.0)
MCV: 59.3 fL — ABNORMAL LOW (ref 80.0–100.0)
Platelets: 375 10*3/uL (ref 150–400)
RBC: 1.72 MIL/uL — ABNORMAL LOW (ref 3.87–5.11)
RDW: 22.4 % — ABNORMAL HIGH (ref 11.5–15.5)
WBC: 12 10*3/uL — ABNORMAL HIGH (ref 4.0–10.5)
nRBC: 0.2 % (ref 0.0–0.2)

## 2020-05-16 LAB — PROCALCITONIN: Procalcitonin: 0.47 ng/mL

## 2020-05-16 LAB — ABO/RH: ABO/RH(D): O POS

## 2020-05-16 MED ORDER — SODIUM CHLORIDE 0.9 % IV SOLN
2.0000 g | Freq: Once | INTRAVENOUS | Status: AC
  Administered 2020-05-16: 2 g via INTRAVENOUS
  Filled 2020-05-16: qty 2

## 2020-05-16 MED ORDER — GLYCOPYRROLATE 0.2 MG/ML IJ SOLN
0.2000 mg | INTRAMUSCULAR | Status: DC | PRN
  Administered 2020-05-16 – 2020-05-24 (×6): 0.2 mg via INTRAVENOUS
  Filled 2020-05-16 (×7): qty 1

## 2020-05-16 MED ORDER — DEXTROSE 5 % IV SOLN: INTRAVENOUS | Status: DC

## 2020-05-16 MED ORDER — SODIUM CHLORIDE 0.9% FLUSH: 3.0000 mL | INTRAVENOUS | Status: DC | PRN

## 2020-05-16 MED ORDER — GLYCOPYRROLATE 1 MG PO TABS
1.0000 mg | ORAL_TABLET | ORAL | Status: DC | PRN
  Filled 2020-05-16: qty 1

## 2020-05-16 MED ORDER — POLYETHYLENE GLYCOL 3350 17 G PO PACK: 17.0000 g | PACK | Freq: Every day | ORAL | Status: DC | PRN

## 2020-05-16 MED ORDER — MORPHINE 100MG IN NS 100ML (1MG/ML) PREMIX INFUSION
0.0000 mg/h | INTRAVENOUS | Status: DC
  Administered 2020-05-16 – 2020-05-21 (×7): 5 mg/h via INTRAVENOUS
  Administered 2020-05-22 – 2020-05-26 (×7): 6 mg/h via INTRAVENOUS
  Administered 2020-05-26 (×2): 7 mg/h via INTRAVENOUS
  Filled 2020-05-16 (×15): qty 100

## 2020-05-16 MED ORDER — POLYVINYL ALCOHOL 1.4 % OP SOLN
1.0000 [drp] | Freq: Four times a day (QID) | OPHTHALMIC | Status: DC | PRN
  Filled 2020-05-16: qty 15

## 2020-05-16 MED ORDER — ONDANSETRON HCL 4 MG/2ML IJ SOLN: 4.0000 mg | Freq: Four times a day (QID) | INTRAMUSCULAR | Status: DC | PRN

## 2020-05-16 MED ORDER — GLYCOPYRROLATE 0.2 MG/ML IJ SOLN
0.2000 mg | INTRAMUSCULAR | Status: DC | PRN
  Administered 2020-05-19: 21:00:00 0.2 mg via SUBCUTANEOUS
  Filled 2020-05-16: qty 1

## 2020-05-16 MED ORDER — ACETAMINOPHEN 325 MG PO TABS: 650.0000 mg | ORAL_TABLET | ORAL | Status: DC | PRN

## 2020-05-16 MED ORDER — SODIUM CHLORIDE 0.9 % IV BOLUS (SEPSIS)
1000.0000 mL | Freq: Once | INTRAVENOUS | Status: AC
  Administered 2020-05-16: 1000 mL via INTRAVENOUS

## 2020-05-16 MED ORDER — SODIUM CHLORIDE 0.9 % IV SOLN: 250.0000 mL | INTRAVENOUS | Status: DC | PRN

## 2020-05-16 MED ORDER — MORPHINE BOLUS VIA INFUSION
5.0000 mg | INTRAVENOUS | Status: DC | PRN
  Administered 2020-05-26: 11:00:00 5 mg via INTRAVENOUS
  Filled 2020-05-16: qty 5

## 2020-05-16 MED ORDER — SODIUM CHLORIDE 0.9 % IV SOLN: 10.0000 mL/h | Freq: Once | INTRAVENOUS | Status: DC

## 2020-05-16 MED ORDER — SODIUM CHLORIDE 0.9 % IV BOLUS (SEPSIS): 250.0000 mL | Freq: Once | INTRAVENOUS | Status: DC

## 2020-05-16 MED ORDER — ACETAMINOPHEN 325 MG PO TABS: 650.0000 mg | ORAL_TABLET | Freq: Four times a day (QID) | ORAL | Status: DC | PRN

## 2020-05-16 MED ORDER — ACETAMINOPHEN 650 MG RE SUPP: 650.0000 mg | Freq: Four times a day (QID) | RECTAL | Status: DC | PRN

## 2020-05-16 MED ORDER — SODIUM CHLORIDE 0.9% FLUSH: 3.0000 mL | Freq: Two times a day (BID) | INTRAVENOUS | Status: DC

## 2020-05-16 MED ORDER — FAMOTIDINE IN NACL 20-0.9 MG/50ML-% IV SOLN: 20.0000 mg | Freq: Two times a day (BID) | INTRAVENOUS | Status: DC

## 2020-05-16 MED ORDER — LEVETIRACETAM IN NACL 1000 MG/100ML IV SOLN
1000.0000 mg | Freq: Once | INTRAVENOUS | Status: AC
  Administered 2020-05-16: 1000 mg via INTRAVENOUS
  Filled 2020-05-16: qty 100

## 2020-05-16 MED ORDER — METRONIDAZOLE IN NACL 5-0.79 MG/ML-% IV SOLN
500.0000 mg | Freq: Once | INTRAVENOUS | Status: AC
  Administered 2020-05-16: 500 mg via INTRAVENOUS
  Filled 2020-05-16: qty 100

## 2020-05-16 MED ORDER — DIPHENHYDRAMINE HCL 50 MG/ML IJ SOLN: 25.0000 mg | INTRAMUSCULAR | Status: DC | PRN

## 2020-05-16 MED ORDER — SODIUM CHLORIDE 0.9 % IV BOLUS
500.0000 mL | Freq: Once | INTRAVENOUS | Status: AC
  Administered 2020-05-16: 500 mL via INTRAVENOUS

## 2020-05-16 MED ORDER — MIDAZOLAM HCL 2 MG/2ML IJ SOLN: 2.0000 mg | INTRAMUSCULAR | Status: DC | PRN

## 2020-05-16 MED ORDER — VANCOMYCIN HCL IN DEXTROSE 1-5 GM/200ML-% IV SOLN: 1000.0000 mg | Freq: Once | INTRAVENOUS | Status: DC

## 2020-05-16 MED ORDER — MORPHINE SULFATE (PF) 2 MG/ML IV SOLN: 2.0000 mg | INTRAVENOUS | Status: DC | PRN

## 2020-05-16 MED ORDER — DOCUSATE SODIUM 100 MG PO CAPS: 100.0000 mg | ORAL_CAPSULE | Freq: Two times a day (BID) | ORAL | Status: DC | PRN

## 2020-05-16 NOTE — Progress Notes (Signed)
[facility] notified. Referral [id number]

## 2020-05-16 NOTE — ED Notes (Signed)
Patient found to have multiple bugs crawling on clothes and sheet. No obvious bites or scars noted to patient's skin. MD aware.

## 2020-05-16 NOTE — ED Provider Notes (Signed)
[facility] Emergency Department Provider Note   ____________________________________________    I have reviewed the triage vital signs and the nursing notes.   HISTORY  Chief Complaint Seizures   Patient unable to provide any history at this time.  HPI Lacey Ray is a 79 y.o. female with a history of hypertension and seizures who presents after reported seizure.  Apparently the patient had a seizure yesterday evening and again this morning.  Is prescribed Keppra, unclear status of compliance.  Review of medical records demonstrates the patient sees Dr. Sherryll Burger of neurology, has a history of epilepsy since a child, has had epilepsy surgery at [facility] in the past.  Has been maintained on twice daily Keppra 500.  Was given intranasal Versed by EMS  Past Medical History:  Diagnosis Date  . Hypertension   . Seizures (HCC)   . Stroke [facility])     Patient Active Problem List   Diagnosis Date Noted  . Acidosis, lactic 06-04-20  . Anal fissure 04/05/2020  . Hypertension   . Seizure (HCC) 04/17/2018  . Generalized weakness 04/17/2018  . Failure to thrive in adult 04/17/2018  . Essential hypertension 04/17/2018    History reviewed. No pertinent surgical history.  Prior to Admission medications   Medication Sig Start Date End Date Taking? Authorizing Provider  amLODipine (NORVASC) 10 MG tablet Take 1 tablet by mouth once daily 03/27/20  Yes Malfi, Jodelle Gross, FNP  levETIRAcetam (KEPPRA) 500 MG tablet Take 1 tablet (500 mg total) by mouth 2 (two) times daily. 01/05/20  Yes Malfi, Jodelle Gross, FNP  lidocaine-prilocaine (EMLA) cream Apply 1 application topically as needed. 04/05/20  Yes Malfi, Jodelle Gross, FNP     Allergies Patient has no known allergies.  No family history on file.  Social History Social History   Tobacco Use  . Smoking status: Never Smoker  . Smokeless tobacco: Never Used  Vaping Use  . Vaping Use: Never used  Substance Use Topics  .  Alcohol use: Never  . Drug use: Never    Unable to obtain review of Systems     ____________________________________________   PHYSICAL EXAM:  VITAL SIGNS: ED Triage Vitals  Enc Vitals Group     BP      Pulse      Resp      Temp      Temp src      SpO2      Weight      Height      Head Circumference      Peak Flow      Pain Score      Pain Loc      Pain Edu?      Excl. in GC?     Constitutional: Opens eyes to name, appears postictal Eyes: Conjunctivae are normal.  PERRLA Head: Atraumatic.  Mouth/Throat: Mucous membranes are moist.   Neck:  Painless ROM, no vertebral tenderness palpation Cardiovascular: Normal rate, regular rhythm. Peri [place] peripheral circulation.  No chest wall tenderness palpation Respiratory: Normal respiratory effort.  No retractions. Lungs CTAB. Gastrointestinal: Soft and nontender. No distention.  Rectal exam: Light brown hard stool, guaiac negative  Musculoskeletal:  Warm and well perfused Neurologic:  Normal speech and language. No gross focal neurologic deficits are appreciated.  Skin:  Skin is warm, dry and intact. No rash noted. Psychiatric: Mood and affect are normal. Speech and behavior are normal.  ____________________________________________   LABS (all labs ordered are listed, but  only abnormal results are displayed)  Labs Reviewed  COMPREHENSIVE METABOLIC PANEL - Abnormal; Notable for the following components:      Result Value   CO2 8 (*)    Glucose, Bld 102 (*)    BUN 30 (*)    Creatinine, Ser 1.83 (*)    Albumin 3.4 (*)    AST 50 (*)    GFR calc non Af Amer 26 (*)    GFR calc Af Amer 30 (*)    Anion gap 24 (*)    All other components within normal limits  CBC - Abnormal; Notable for the following components:   WBC 12.0 (*)    RBC 1.72 (*)    Hemoglobin 2.6 (*)    HCT 10.2 (*)    MCV 59.3 (*)    MCH 15.1 (*)    MCHC 25.5 (*)    RDW 22.4 (*)    All other components within normal limits  LACTIC ACID, PLASMA -  Abnormal; Notable for the following components:   Lactic Acid, Venous >11.0 (*)    All other components within normal limits  LACTIC ACID, PLASMA - Abnormal; Notable for the following components:   Lactic Acid, Venous >11.0 (*)    All other components within normal limits  URINALYSIS, COMPLETE (UACMP) WITH MICROSCOPIC - Abnormal; Notable for the following components:   Color, Urine YELLOW (*)    APPearance CLOUDY (*)    Ketones, ur 5 (*)    Protein, ur 30 (*)    Bacteria, UA MANY (*)    All other components within normal limits  SARS CORONAVIRUS 2 BY RT PCR (HOSPITAL ORDER, PERFORMED IN [facility])  CULTURE, BLOOD (ROUTINE X 2)  CULTURE, BLOOD (ROUTINE X 2)  URINE CULTURE  PROCALCITONIN  URINALYSIS, ROUTINE W REFLEX MICROSCOPIC  PREPARE RBC (CROSSMATCH)  TYPE AND SCREEN  ABO/RH   ____________________________________________  EKG  ED ECG REPORT I, Jene Every, the attending physician, personally viewed and interpreted this ECG.  Date: 05/12/2020  Rhythm: normal sinus rhythm QRS Axis: normal Intervals: QT prolonged ST/T Wave abnormalities: normal Narrative Interpretation: no evidence of acute ischemia  ____________________________________________  RADIOLOGY  Chest x-ray reviewed by me, possible mild edema ____________________________________________   PROCEDURES  Procedure(s) performed: yes  Angiocath insertion Performed by: Jene Every  Consent: Verbal consent obtained. Risks and benefits: risks, benefits and alternatives were discussed Time out: Immediately prior to procedure a "time out" was called to verify the correct patient, procedure, equipment, support staff and site/side marked as required.  Preparation: Patient was prepped and draped in the usual sterile fashion.  Vein Location: right AC  Ultrasound Guided  Gauge: 18  Normal blood return and flush without difficulty Patient tolerance: Patient tolerated the procedure well  with no immediate complications.     Procedures   Critical Care performed: yes  CRITICAL CARE Performed by: Jene Every   Total critical care time:  Critical care time was exclusive of separately billable procedures and treating other patients.  Critical care was necessary to treat or prevent imminent or life-threatening deterioration.  Critical care was time spent personally by me on the following activities: development of treatment plan with patient and/or surrogate as well as nursing, discussions with consultants, evaluation of patient's response to treatment, examination of patient, obtaining history from patient or surrogate, ordering and performing treatments and interventions, ordering and review of laboratory studies, ordering and review of radiographic studies, pulse oximetry and re-evaluation of patient's condition.  ____________________________________________  INITIAL IMPRESSION / ASSESSMENT AND PLAN / ED COURSE  Pertinent labs & imaging results that were available during my care of the patient were reviewed by me and considered in my medical decision making (see chart for details).  Patient presents after seizure.  She does have a history of seizures, and sees Dr. Sherryll Burger of neurology.  Likely postictal here.  Will require close monitoring, no evidence of head trauma.  ----------------------------------------- 9:23 AM on 2020-05-29 ----------------------------------------- Notified of blood pressure 94/41 and mild hypothermia, this raises a concern for possible infection/sepsis although could be related to seizure as well, will add on lactate, blood cultures, chest x-ray and urinalysis.  Blood pressure may be related to dehydration as well given elevated BUN to creatinine ratio.  We will give 500 cc bolus of fluids.  Currently no source of infection  ----------------------------------------- 10:47 AM on  2020-05-29 ----------------------------------------- Staff has had very difficult time obtaining labs from patient, finally able to obtain CBC which just resulted and demonstrates elevated white blood cell count but more concerning hemoglobin of 2.8.  Have ordered 2 units of packed red blood cell, guaiac negative on exam .  patient has become more hypothermic, we have activated a code sepsis given worsening hypothermia, husband's report of waking up in a sweat, and elevated white blood cell count.  Lactic acid is also elevated which can be elevated in the setting of seizures as well.  will give broad-spectrum antibiotics, additional fluids to equal 30 mils per kilogram given initial low blood pressure and elevated lactic acid.  ----------------------------------------- 11:18 AM on 2020-05-29 -----------------------------------------  Placed 18-gauge IV using ultrasound given difficulty with access.  Have consulted intensive care for admission   ED Sepsis - Repeat Assessment   Performed at:    11:15 a  Last Vitals:    Blood pressure (!) 101/46, pulse 77, temperature (!) 93.4 F (34.1 C), temperature source Rectal, resp. rate (!) 25, SpO2 92 %.  Heart:      RRR  Lungs:     No rales heard   Capillary Refill:   Normal <2  Peripheral Pulse (include location): Left radial, normal   Skin (include color):   Normal, pink       ____________________________________________   FINAL CLINICAL IMPRESSION(S) / ED DIAGNOSES  Final diagnoses:  Seizure (HCC)  Sepsis with encephalopathy and septic shock, due to unspecified organism (HCC)  Symptomatic anemia        Note:  This document was prepared using Dragon voice recognition software and may include unintentional dictation errors.   Jene Every, MD May 29, 2020 [phone]

## 2020-05-16 NOTE — ED Triage Notes (Signed)
Patient from home via [facility]. Per EMS, patient had seizure yesterday, history of seizures. This morning patient again had seizure-like activity that lasted approximately 30 minutes. Given 2 mg intranasal versed prior to arrival. Patient unresponsive upon EMS arrival to scene. Responsive to pain upon arrival to ED.

## 2020-05-16 NOTE — Progress Notes (Signed)
Pt to 112 from ccu  Unresponsive  But arouses  Slightly when  Spoken to  And  Repos. dtr and  Husband  With pt.02 2l [place].

## 2020-05-16 NOTE — Progress Notes (Signed)
Two units of PRBCs completed at 1410 hrs.

## 2020-05-16 NOTE — H&P (Signed)
Name: Lacey Ray MRN: [id number] DOB: 1940-12-02     CONSULTATION DATE: 04/19/2020  REFERRING MD :  Cyril Loosen  CHIEF COMPLAINT:  SEIZURES  HISTORY OF PRESENT ILLNESS:  79 yo AAF with acute seizures and mental status changes with severe ANEMIA and severe acidosis  CODE SEPSIS was called in ER but patient with normal UA and no abnormality seen on CXR  Need to consider encephalitis and meningitis CT head pending  Patient with increased WOB severe resp distress +hypothermia +using accessory muscles to breathe   The Clinical status was relayed to family in detail Daughter Lacey Ray  Updated and notified of patients medical condition.  Patient remains unresponsive and will not open eyes to command.   Upon assessment his breath sounds are course crackles with significant secretions to his oral pharyngeal region.  Patient is having a weak cough and struggling to remove secretions.  patient with increased WOB and using accessory muscles to breathe.   Explained to family course of therapy and the modalities     Patient with Progressive multiorgan failure with very low chance of meaningful recovery despite all aggressive and optimal medical therapy.  Patient is in the dying  Process associated with suffering.  Family understands the situation.  They have consented and agreed to DNR/DNI and would like to proceed with Comfort care measures when they arrive         PAST MEDICAL HISTORY :   has a past medical history of Hypertension and Seizures (HCC).  has no past surgical history on file. Prior to Admission medications   Medication Sig Start Date End Date Taking? Authorizing Provider  amLODipine (NORVASC) 10 MG tablet Take 1 tablet by mouth once daily 03/27/20   Malfi, Jodelle Gross, FNP  levETIRAcetam (KEPPRA) 500 MG tablet Take 1 tablet (500 mg total) by mouth 2 (two) times daily. 01/05/20   Malfi, Jodelle Gross, FNP  lidocaine-prilocaine (EMLA) cream Apply 1 application  topically as needed. 04/05/20   Malfi, Jodelle Gross, FNP   No Known Allergies  FAMILY HISTORY:  family history is not on file. SOCIAL HISTORY:  reports that she has never smoked. She has never used smokeless tobacco. She reports that she does not drink alcohol and does not use drugs.  REVIEW OF SYSTEMS:   Unable to obtain due to critical illness      Estimated body mass index is 20.16 kg/m as calculated from the following:   Height as of 01/05/20: 5\' 2"  (1.575 m).   Weight as of this encounter: 50 kg.    VITAL SIGNS: Temp:  [93 F (33.9 C)-95 F (35 C)] 93 F (33.9 C) (06/29 1113) Pulse Rate:  [77-88] 88 (06/29 1113) Resp:  [19-35] 35 (06/29 1113) BP: (88-115)/(37-48) 115/48 (06/29 1113) SpO2:  [92 %-100 %] 100 % (06/29 1113) Weight:  [50 kg] 50 kg (06/29 0800)   No intake/output data recorded. No intake/output data recorded.   SpO2: 100 %   Physical Examination:  GENERAL:critically ill appearing, +resp distress HEAD: Normocephalic, atraumatic.  EYES: Pupils equal, round, reactive to light.  No scleral icterus.  MOUTH: Moist mucosal membrane. NECK: Supple. No JVD.  PULMONARY: +rhonchi, +wheezing CARDIOVASCULAR: S1 and S2. Regular rate and rhythm. No murmurs, rubs, or gallops.  GASTROINTESTINAL: Soft, nontender, -distended.  Positive bowel sounds.  MUSCULOSKELETAL: No swelling, clubbing, or edema.  NEUROLOGIC: obtunded SKIN:intact,warm,dry   MEDICATIONS: I have reviewed all medications and confirmed regimen as documented   CULTURE RESULTS   No results found  for this or any previous visit (from the past 240 hour(s)).        IMAGING    DG Chest Port 1 View  Result Date: 05/17/2020 CLINICAL DATA:  Weakness. EXAM: PORTABLE CHEST 1 VIEW COMPARISON:  No prior. FINDINGS: Borderline cardiomegaly. No pulmonary venous congestion. Mild bilateral interstitial prominence. Interstitial edema and/or pneumonitis can not be excluded. No pleural effusion or pneumothorax.  Thoracic spine scoliosis and degenerative change. IMPRESSION: 1.  Borderline cardiomegaly.  No pulmonary venous congestion. 2. Mild bilateral interstitial prominence. Interstitial edema and/or pneumonitis cannot be excluded. Electronically Signed   By: Maisie Fus  Register   On: 05/03/2020 09:37     Nutrition Status:           ASSESSMENT AND PLAN SYNOPSIS   Severe ACUTE Hypoxic and Hypercapnic Respiratory Failure due to seizures and inability to protect airway with multiorgan failure with progressive metabolic acidosis and renal failure  At this time, patient is suffocating and struggling to breathe She is in the dying process    Acute toxic metabolic encephalopathy Progressive encephalopathy   DVT/GI PRX ordered and assessed TRANSFUSIONS AS NEEDED MONITOR FSBS I Assessed the need for Labs I Assessed the need for Foley I Assessed the need for Central Venous Line Family Discussion when available I Assessed the need for Mobilization I made an Assessment of medications to be adjusted accordingly Safety Risk assessment Completed  CASE DISCUSSED IN MULTIDISCIPLINARY ROUNDS WITH ICU TEAM   Critical Care Time devoted to patient care services described in this note is 54 minutes.   Overall, patient is critically ill, prognosis is guarded.  Patient with Multiorgan failure and at high risk for cardiac arrest and death.   Patient is now DNR/DNI, plan for comfort measures when daughter arrives  Lucie Leather, M.D.  [facility]  Medical Director [facility] Medical Director [facility] Cardio-Pulmonary Department

## 2020-05-16 NOTE — Progress Notes (Signed)
Secure chat with Dr Belia Heman. He informed me that pt is transitioning to comfort care. Requested to cancel Code Sepsis.

## 2020-05-16 NOTE — ED Notes (Addendum)
Emergency Unit of blood given  Type: O Positive Unit: [id number] Volume: 360 ml  Patient and blood verified by this RN and Assunta Gambles, RN  Started at 125 ml/hr

## 2020-05-16 NOTE — Progress Notes (Signed)
PHARMACY -  BRIEF ANTIBIOTIC NOTE   Pharmacy has received consult(s) for cefepime and vancomycin from an ED provider.  The patient's profile has been reviewed for ht/wt/allergies/indication/available labs.    One time order(s) placed for cefepime 2 g IV x1 and vancomycin 1 g IV x1 by EDP.   Of note, no weight in chart, pt not on bed with scale, last estimated weight ~50 kg per ED RN.   Further antibiotics/pharmacy consults should be ordered by admitting physician if indicated.                       Thank you, Marty Heck May 24, 2020  10:57 AM

## 2020-05-16 NOTE — Progress Notes (Signed)
CODE SEPSIS - PHARMACY COMMUNICATION  **Broad Spectrum Antibiotics should be administered within 1 hour of Sepsis diagnosis**  Time Code Sepsis Called/Page Received: 1041 per consult  Antibiotics Ordered: cefepime, Flagyl, Vancomycin  Time of 1st antibiotic administration: 1120  Additional action taken by pharmacy: Called RN for pt's weight  If necessary, Name of Provider/Nurse Contacted:     Marty Heck ,PharmD Clinical Pharmacist  26-May-2020  11:42 AM

## 2020-05-16 NOTE — Progress Notes (Signed)
Notified provider of need to order repeat lactic acid. ° °

## 2020-05-16 NOTE — ED Notes (Signed)
Lab at bedside to obtain specimen.

## 2020-05-16 NOTE — Progress Notes (Signed)
CH visited pt.'s rm. after observing family distraught, talking w/ Dr. Belia Heman; Dr. Belia Heman said pt. is on comfort care and that family may need support.  Pt.'s dtr. Elnita Maxwell and husband Lyda Jester at bedside.  They said pt. has had seizures since a young age, but that these have never been serious enough for her to be taken to the hospital; usually seizures resolve and pt. 'bounces back' they shared.  This AM pt. experienced seizures that seemed concerningly different from normal ones --> pt. brought to [facility] ED.  Pt. lives w/husband and dtr. has been living w/thm to help take care of pt.  Dtr. and husband seemed to be in shock in talking w/CH; they requested prayer and CH offered prayer for strength and divine presence for them and pt.; CH also offered prayer of committal for pt.  No further needs at this time.  Situation passed on to evening chaplain at end of shift.    28-May-2020 1500  Clinical Encounter Type  Visited With Patient and family together  Visit Type Initial;Psychological support;Social support;Spiritual support;Critical Care;Patient actively dying  Referral From Physician  Consult/Referral To Chaplain  Spiritual Encounters  Spiritual Needs Grief support;Emotional;Prayer  Stress Factors  Family Stress Factors Major life changes;Loss of control;Health changes;Loss

## 2020-05-17 DIAGNOSIS — Z66 Do not resuscitate: Secondary | ICD-10-CM

## 2020-05-17 DIAGNOSIS — Z515 Encounter for palliative care: Secondary | ICD-10-CM

## 2020-05-17 DIAGNOSIS — R569 Unspecified convulsions: Secondary | ICD-10-CM

## 2020-05-17 LAB — BPAM RBC
Blood Product Expiration Date: 202107212359
Blood Product Expiration Date: 202107212359
ISSUE DATE / TIME: 202106291105
ISSUE DATE / TIME: 202106291105
Unit Type and Rh: 5100
Unit Type and Rh: 5100

## 2020-05-17 LAB — TYPE AND SCREEN
ABO/RH(D): O POS
Antibody Screen: NEGATIVE
Unit division: 0
Unit division: 0

## 2020-05-17 NOTE — Progress Notes (Signed)
CONTINUE COMFORT CARE MEASURES CONTINUE MORPHINE INFUSION PALLIATIVE CARE TEAM CONSULTED 

## 2020-05-17 NOTE — Consult Note (Signed)
Consultation Note Date: 05/17/2020   Patient Name: Lacey Ray  DOB: 06-27-1941  MRN: [id number]  Age / Sex: 79 y.o., female  PCP: Tarri Fuller, FNP Referring Physician: Erin Fulling, MD  Reason for Consultation: Pain control, Psychosocial/spiritual support and Terminal Care  HPI/Patient Profile: 79 y.o. female  with past medical history of HTN and seizures admitted on 05/02/2020 with seizure. Found to have severe anemia and severe acidosis. Patient unresponsive with increased WOB and hypothermia. Discussion was had between patient's family and Dr. Belia Heman. Family consented to full comfort care. PMT consulted to assist with terminal care and disposition.  Clinical Assessment and Goals of Care: I have reviewed medical records including EPIC notes, labs and imaging, received report from Dr. Belia Heman and Baltazar Najjar, RN, and assessed the patient.  Family had left when I went to visit patient. Upon my assessment patient appears to be actively dying - respirations and slow and agonal. Weak pulse. Unresponsive. She remains on morphine infusion at 5 mg/hr. No signs/symptoms of discomfort.   I anticipate hospital death - do not think we should attempt transfer to hospice facility.   Primary Decision Maker NEXT OF KIN - spouse  SUMMARY OF RECOMMENDATIONS   - continue full comfort measures - would not attempt transfer to hospice facility as she appears to be actively dying - anticipate death soon - orders reviewed and remain appropriate - patient appears comfortable on current regimen  Code Status/Advance Care Planning:  DNR   Symptom Management:   Continue morphine infusion, PRN versed  Additional Recommendations (Limitations, Scope, Preferences):  Full Comfort Care  Prognosis:   Hours - Days  Discharge Planning: Anticipated Hospital Death      Primary Diagnoses: Present on Admission: . Acidosis, lactic   I have reviewed the medical  record, interviewed the patient and family, and examined the patient. The following aspects are pertinent.  Past Medical History:  Diagnosis Date  . Hypertension   . Seizures (HCC)   . Stroke [facility])    Social History   Socioeconomic History  . Marital status: Married    Spouse name: Not on file  . Number of children: Not on file  . Years of education: Not on file  . Highest education level: Not on file  Occupational History  . Not on file  Tobacco Use  . Smoking status: Never Smoker  . Smokeless tobacco: Never Used  Vaping Use  . Vaping Use: Never used  Substance and Sexual Activity  . Alcohol use: Never  . Drug use: Never  . Sexual activity: Not on file  Other Topics Concern  . Not on file  Social History Narrative  . Not on file   Social Determinants of Health   Financial Resource Strain:   . Difficulty of Paying Living Expenses:   Food Insecurity:   . Worried About Programme researcher, broadcasting/film/video in the Last Year:   . Barista in the Last Year:   Transportation Needs:   . Freight forwarder (Medical):   Marland Kitchen Lack of Transportation (Non-Medical):   Physical Activity:   . Days of Exercise per Week:   . Minutes of Exercise per Session:   Stress:   . Feeling of Stress :   Social Connections:   . Frequency of Communication with Friends and Family:   . Frequency of Social Gatherings with Friends and Family:   . Attends Religious Services:   . Active Member of Clubs or Organizations:   . Attends Club  or Organization Meetings:   Marland Kitchen Marital Status:    No family history on file. Scheduled Meds: Continuous Infusions: . sodium chloride    . morphine 5 mg/hr (05/17/20 0839)  . sodium chloride     PRN Meds:.acetaminophen **OR** acetaminophen, acetaminophen, diphenhydrAMINE, docusate sodium, glycopyrrolate **OR** glycopyrrolate **OR** glycopyrrolate, midazolam, morphine injection, morphine, ondansetron (ZOFRAN) IV, polyvinyl alcohol No Known Allergies Review of Systems    Unable to perform ROS: Patient nonverbal    Physical Exam Constitutional:      Comments: unresponsive  Pulmonary:     Comments: agonal Skin:    Comments: Cool extremities     Vital Signs: BP (!) 109/49 (BP Location: Left Arm)   Pulse 68   Temp 98 F (36.7 C) (Oral)   Resp (!) 6   Wt 50 kg   SpO2 97%   BMI 20.16 kg/m  Pain Scale: PAINAD       SpO2: SpO2: 97 % O2 Device:SpO2: 97 % O2 Flow Rate: .   IO: Intake/output summary:   Intake/Output Summary (Last 24 hours) at 05/17/2020 1541 Last data filed at 05/17/2020 0300 Gross per 24 hour  Intake 54.56 ml  Output --  Net 54.56 ml    LBM:   Baseline Weight: Weight: 50 kg Most recent weight: Weight: 50 kg     Palliative Assessment/Data: PPS 10%    Time Total: 30 minutes Greater than 50%  of this time was spent counseling and coordinating care related to the above assessment and plan.  Gerlean Ren, DNP, AGNP-C Palliative Medicine Team [phone] Pager: [phone]

## 2020-05-18 DIAGNOSIS — G9341 Metabolic encephalopathy: Secondary | ICD-10-CM | POA: Diagnosis present

## 2020-05-18 DIAGNOSIS — G9340 Encephalopathy, unspecified: Secondary | ICD-10-CM

## 2020-05-18 DIAGNOSIS — R6521 Severe sepsis with septic shock: Secondary | ICD-10-CM | POA: Diagnosis present

## 2020-05-18 DIAGNOSIS — A419 Sepsis, unspecified organism: Principal | ICD-10-CM

## 2020-05-18 NOTE — Care Management (Signed)
This is a no charge note  PCCM pick up from Dr. Belia Heman   79 year old lady with past medical history of hypertension, stroke, seizure, who was admitted due to seizure, altered mental status, severe anemia.  Hemoglobin 2.6 currently.  Patient is comfort care.  Palliative care is consulted.  Lorretta Harp, MD  [facility]   If 7PM-7AM, please contact night-coverage [email or web address] 05/18/2020, 6:26 PM

## 2020-05-18 NOTE — Progress Notes (Signed)
CONTINUE COMFORT CARE MEASURES CONTINUE MORPHINE INFUSION PALLIATIVE CARE TEAM CONSULTED

## 2020-05-18 NOTE — Progress Notes (Signed)
Daily Progress Note   Patient Name: Lacey Ray       Date: 05/18/2020 DOB: [birth year]  Age: 79 y.o. MRN#: [id number] Attending Physician: Erin Fulling, MD Primary Care Physician: Tarri Fuller, FNP Admit Date: 04/30/2020  Reason for Consultation/Follow-up: Terminal Care  Subjective: No family at bedside. Patient unresponsive.   Length of Stay: 2  Current Medications: Scheduled Meds:    Continuous Infusions: . sodium chloride    . morphine 5 mg/hr (05/18/20 0404)  . sodium chloride      PRN Meds: acetaminophen **OR** acetaminophen, acetaminophen, diphenhydrAMINE, docusate sodium, glycopyrrolate **OR** glycopyrrolate **OR** glycopyrrolate, midazolam, morphine injection, morphine, ondansetron (ZOFRAN) IV, polyvinyl alcohol  Physical Exam Constitutional:      General: She is not in acute distress.    Comments: unresponsive  Pulmonary:     Comments: Agonal respirations Skin:    Comments: Cool lower extremities             Vital Signs: BP (!) 124/53 (BP Location: Left Arm)   Pulse 80   Temp (!) 97.4 F (36.3 C) (Oral)   Resp 10   Wt 50 kg   SpO2 96%   BMI 20.16 kg/m  SpO2: SpO2: 96 % O2 Device: O2 Device: Nasal Cannula O2 Flow Rate:    Intake/output summary: No intake or output data in the 24 hours ending 05/18/20 1422 LBM:   Baseline Weight: Weight: 50 kg Most recent weight: Weight: 50 kg       Palliative Assessment/Data: PPS 10%    Flowsheet Rows     Most Recent Value  Intake Tab  Referral Department Critical care  Unit at Time of Referral Med/Surg Unit  Palliative Care Primary Diagnosis Sepsis/Infectious Disease  Date Notified 05/15/2020  Palliative Care Type New Palliative care  Reason for referral End of Life Care Assistance  Date of Admission 05/14/2020  Date  first seen by Palliative Care 05/17/20  # of days Palliative referral response time 1 Day(s)  # of days IP prior to Palliative referral 0  Clinical Assessment  Palliative Performance Scale Score 10%  Psychosocial & Spiritual Assessment  Palliative Care Outcomes  Patient/Family meeting held? No  Palliative Care Outcomes Provided end of life care assistance      Patient Active Problem List   Diagnosis Date Noted  . Terminal care   . DNR (do not resuscitate)   . Acidosis, lactic 05/15/2020  . Anal fissure 04/05/2020  . Hypertension   . Seizure (HCC) 04/17/2018  . Generalized weakness 04/17/2018  . Failure to thrive in adult 04/17/2018  . Essential hypertension 04/17/2018    Palliative Care Assessment & Plan   HPI: 79 y.o. female  with past medical history of HTN and seizures admitted on 05/08/2020 with seizure. Found to have severe anemia and severe acidosis. Patient unresponsive with increased WOB and hypothermia. Discussion was had between patient's family and Dr. Belia Heman. Family consented to full comfort care. PMT consulted to assist with terminal care and disposition.  Assessment: Patient evaluated - no signs of discomfort. Remains unresponsive with agonal respirations. Do not think she could tolerate transfer to hospice home. Anticipate hospital death.   Recommendations/Plan:  Continue full comfort care and anticipate  hospital death  Goals of Care and Additional Recommendations:  Limitations on Scope of Treatment: Full Comfort Care  Code Status:  DNR  Prognosis:   Hours - Days  Discharge Planning:  Anticipated Hospital Death  Care plan was discussed with RN  Thank you for allowing the Palliative Medicine Team to assist in the care of this patient.   Total Time 15 minutes Prolonged Time Billed  no       Greater than 50%  of this time was spent counseling and coordinating care related to the above assessment and plan.  Gerlean Ren, DNP,  [facility] Palliative Medicine Team Team Phone # [phone]  Pager [phone]

## 2020-05-18 DEATH — deceased

## 2020-05-19 NOTE — Progress Notes (Signed)
PROGRESS NOTE    Lacey Ray   DJS:[id number]  DOB: 21-May-1941  PCP: Tarri Fuller, FNP    DOA: 05/09/2020 LOS: 3   Brief Narrative   79 y.o. female  with past medical history of HTN and seizures admitted on 04/24/2020 with seizure. Found to have severe anemia and severe acidosis. Patient unresponsive with severe respiratory distress, increased WOB with accessory muscle use, in addition to hypothermia.  Patient remained unresponsive in ICU, with ongoing respiratory distress and progressive multiorgan system failure and very low chance of meaningful recovery.    Discussion was had between patient's family and intensivist, Dr. Belia Heman. Family consented to full comfort care.     Assessment & Plan   Principal Problem:   Comfort measures only status Active Problems:   Acidosis, lactic   DNR (do not resuscitate)   Sepsis with encephalopathy and septic shock (HCC)   Comfort Care Status - continue comfort care per orders including morphine infusion.  Notify MD if any signs of distress or discomfort despite comfort medications.    Sepsis with encephalopathy and septic shock - present on admission. Lactic acidosis - present on admission. Due to patient's severity of illness on arrival, and transition to comfort care, further evaluation and treatment for sepsis and lactic acidosis were not pursued.    Patient BMI: Body mass index is 20.16 kg/m.   DVT prophylaxis: SCDs Start: 05/15/2020 1128   Diet:  Diet Orders (From admission, onward)    Start     Ordered   05/17/2020 1128  Diet NPO time specified  Diet effective now        05/03/2020 1128            Code Status: DNR    Subjective 05/19/20    Patient seen at bedside, no family present.  She appears comfortable without signs of distress.  Unresponsive to voice and tactile stimulus.  No acute events.    Disposition Plan & Communication   Status is: Inpatient  Remains inpatient appropriate because: patient is actively  dying and not stable for transport to hospice.  Anticipate imminent in hospital death.   Dispo: The patient is from: Home              Anticipated d/c is to: Funeral home              Anticipated d/c date is: 1 day              Patient currently is not medically stable to d/c.   Family Communication: none at bedside, will attempt to call.    Consults, Procedures, Significant Events   Consultants:   Palliative care  Procedures:   None  Antimicrobials:   none    Objective   Vitals:   05/17/20 2300 05/18/20 0811 05/19/20 0608 05/19/20 0815  BP: (!) 116/41 (!) 124/53 (!) 119/46 (!) 121/49  Pulse: 67 80 79 83  Resp: 10 10 16 10   Temp: 98 F (36.7 C) (!) 97.4 F (36.3 C) 97.6 F (36.4 C) 97.6 F (36.4 C)  TempSrc: Axillary Oral Oral Oral  SpO2: 100% 96% (!) 89% (!) 89%  Weight:       No intake or output data in the 24 hours ending 05/19/20 1257 Filed Weights   04/27/2020 0800  Weight: 50 kg    Physical Exam:  General exam: sleeping comfortably, no acute distress Respiratory system: agonal breathing, upper airway secretion sounds, normal respiratory effort. Cardiovascular system: normal S1/S2, RRR, no  edema Gastrointestinal system: soft, NT, ND   Labs   Data Reviewed: I have personally reviewed following labs and imaging studies  CBC: Recent Labs  Lab 05/15/2020 0928  WBC 12.0*  HGB 2.6*  HCT 10.2*  MCV 59.3*  PLT 375   Basic Metabolic Panel: Recent Labs  Lab 04/27/2020 0817  NA 142  K 4.7  CL 110  CO2 8*  GLUCOSE 102*  BUN 30*  CREATININE 1.83*  CALCIUM 9.5   GFR: Estimated Creatinine Clearance: 19.4 mL/min (A) (by C-G formula based on SCr of 1.83 mg/dL (H)). Liver Function Tests: Recent Labs  Lab 05/06/2020 0817  AST 50*  ALT 24  ALKPHOS 47  BILITOT 0.8  PROT 7.9  ALBUMIN 3.4*   No results for input(s): LIPASE, AMYLASE in the last 168 hours. No results for input(s): AMMONIA in the last 168 hours. Coagulation Profile: No results for  input(s): INR, PROTIME in the last 168 hours. Cardiac Enzymes: No results for input(s): CKTOTAL, CKMB, CKMBINDEX, TROPONINI in the last 168 hours. BNP (last 3 results) No results for input(s): PROBNP in the last 8760 hours. HbA1C: No results for input(s): HGBA1C in the last 72 hours. CBG: No results for input(s): GLUCAP in the last 168 hours. Lipid Profile: No results for input(s): CHOL, HDL, LDLCALC, TRIG, CHOLHDL, LDLDIRECT in the last 72 hours. Thyroid Function Tests: No results for input(s): TSH, T4TOTAL, FREET4, T3FREE, THYROIDAB in the last 72 hours. Anemia Panel: No results for input(s): VITAMINB12, FOLATE, FERRITIN, TIBC, IRON, RETICCTPCT in the last 72 hours. Sepsis Labs: Recent Labs  Lab 04/21/2020 0817 05/01/2020 0954 04/29/2020 1134  PROCALCITON 0.47  --   --   LATICACIDVEN  --  >11.0* >11.0*    Recent Results (from the past 240 hour(s))  Blood culture (routine x 2)     Status: None (Preliminary result)   Collection Time: 05/04/2020  9:28 AM   Specimen: BLOOD  Result Value Ref Range Status   Specimen Description BLOOD BLOOD RIGHT HAND  Final   Special Requests   Final    BOTTLES DRAWN AEROBIC AND ANAEROBIC Blood Culture adequate volume   Culture   Final    NO GROWTH 3 DAYS Performed at [facility], [address], [place], [place] [address]    Report Status PENDING  Incomplete  Urine culture     Status: Abnormal (Preliminary result)   Collection Time: 05/09/2020 10:17 AM   Specimen: Urine, Random  Result Value Ref Range Status   Specimen Description   Final    URINE, RANDOM Performed at [facility], [address], [place], [place] [address]    Special Requests   Final    NONE Performed at [facility], [address], [place], [place] [address]    Culture (A)  Final    >=100,000 COLONIES/mL ESCHERICHIA COLI SUSCEPTIBILITIES TO FOLLOW REPEATING Performed at [facility], [address] [place], [place] [address]    Report  Status PENDING  Incomplete  SARS Coronavirus 2 by RT PCR (hospital order, performed in [facility] hospital lab) Nasopharyngeal Nasopharyngeal Swab     Status: None   Collection Time: 04/29/2020 11:28 AM   Specimen: Nasopharyngeal Swab  Result Value Ref Range Status   SARS Coronavirus 2 NEGATIVE NEGATIVE Final    Comment: (NOTE) SARS-CoV-2 target nucleic acids are NOT DETECTED.  The SARS-CoV-2 RNA is generally detectable in upper and lower respiratory specimens during the acute phase of infection. The lowest concentration of SARS-CoV-2  viral copies this assay can detect is 250 copies / mL. A negative result does not preclude SARS-CoV-2 infection and should not be used as the sole basis for treatment or other patient management decisions.  A negative result may occur with improper specimen collection / handling, submission of specimen other than nasopharyngeal swab, presence of viral mutation(s) within the areas targeted by this assay, and inadequate number of viral copies (<250 copies / mL). A negative result must be combined with clinical observations, patient history, and epidemiological information.  Fact Sheet for Patients:   BoilerBrush.com.cy  Fact Sheet for Healthcare Providers: https://pope.com/  This test is not yet approved or  cleared by the [place] FDA and has been authorized for detection and/or diagnosis of SARS-CoV-2 by FDA under an Emergency Use Authorization (EUA).  This EUA will remain in effect (meaning this test can be used) for the duration of the COVID-19 declaration under Section 564(b)(1) of the Act, 21 U.S.C. section 360bbb-3(b)(1), unless the authorization is terminated or revoked sooner.  Performed at [facility], [address] [place], [place] [address]       Imaging Studies   No results found.   Medications   Scheduled Meds: Continuous Infusions: . sodium chloride    .  morphine 5 mg/hr (05/19/20 0030)  . sodium chloride         LOS: 3 days    Time spent: 15 minutes    Pennie Banter, DO [facility]  05/19/2020, 12:57 PM    If 7PM-7AM, please contact night-coverage. How to contact the [facility] Attending or Consulting provider 7A - 7P or covering provider during after hours 7P -7A, for this patient?    1. Check the care team in [facility] and look for a) attending/consulting [facility] provider listed and b) the [facility] team listed 2. Log into [email or web address] and use [facility]'s universal password to access. If you do not have the password, please contact the hospital operator. 3. Locate the [facility] provider you are looking for under [facility] and page to a number that you can be directly reached. 4. If you still have difficulty reaching the provider, please page the [facility] (Director on Call) for the Hospitalists listed on amion for assistance.

## 2020-05-19 NOTE — Progress Notes (Signed)
   05/19/20 1045  Clinical Encounter Type  Visited With Patient  Visit Type Initial;Spiritual support  Referral From Nurse  Consult/Referral To Chaplain  Visited Pt while rounding unit Pt is on CC. Ch provided the spirit of presence and a silent pray. Ch will follow-up as needed.

## 2020-05-19 NOTE — Hospital Course (Signed)
79 y.o. female  with past medical history of HTN and seizures admitted on 04/24/2020 with seizure. Found to have severe anemia and severe acidosis. Patient unresponsive with severe respiratory distress, increased WOB with accessory muscle use, in addition to hypothermia.  Patient remained unresponsive in ICU, with ongoing respiratory distress and progressive multiorgan system failure and very low chance of meaningful recovery.    Discussion was had between patient's family and intensivist, Dr. Belia Heman. Family consented to full comfort care.

## 2020-05-20 LAB — URINE CULTURE: Culture: >=100000 — AB

## 2020-05-20 NOTE — Progress Notes (Signed)
PROGRESS NOTE    Lacey Ray   ZOX:[id number]  DOB: 27-Jul-1941  PCP: Tarri Fuller, FNP    DOA: 05/19/20 LOS: 4   Brief Narrative   79 y.o. female  with past medical history of HTN and seizures admitted on 05/19/2020 with seizure. Found to have severe anemia and severe acidosis. Patient unresponsive with severe respiratory distress, increased WOB with accessory muscle use, in addition to hypothermia.  Patient remained unresponsive in ICU, with ongoing respiratory distress and progressive multiorgan system failure and very low chance of meaningful recovery.    Discussion was had between patient's family and intensivist, Dr. Belia Heman. Family consented to full comfort care.     Assessment & Plan   Principal Problem:   Comfort measures only status Active Problems:   Acidosis, lactic   DNR (do not resuscitate)   Sepsis with encephalopathy and septic shock (HCC)   Comfort Care Status - continue comfort care per orders including morphine infusion.  Notify MD if any signs of distress or discomfort despite comfort medications.    Sepsis with encephalopathy and septic shock - present on admission. Lactic acidosis - present on admission. Due to patient's severity of illness on arrival, and transition to comfort care, further evaluation and treatment for sepsis and lactic acidosis were not pursued.    Patient BMI: Body mass index is 20.16 kg/m.   DVT prophylaxis: SCDs Start: 05/19/20 1128   Diet:  Diet Orders (From admission, onward)    Start     Ordered   05-19-2020 1128  Diet NPO time specified  Diet effective now        May 19, 2020 1128            Code Status: DNR    Subjective 05/20/20    Patient seen at bedside, no family present.  She is sleeping comfortably, appears to have no signs of distress.  Unresponsive to voice and tactile stimulus.  No acute events.    Disposition Plan & Communication   Status is: Inpatient  Remains inpatient appropriate because: patient  is actively dying and not stable for transport to hospice.  Anticipate imminent in hospital death.   Dispo: The patient is from: Home              Anticipated d/c is to: Funeral home              Anticipated d/c date is: 1 day              Patient currently is not medically stable to d/c.   Family Communication: none at bedside, will attempt to call.    Consults, Procedures, Significant Events   Consultants:   Palliative care  Procedures:   None  Antimicrobials:   none    Objective   Vitals:   05/18/20 0811 05/19/20 0608 05/19/20 0815 05/19/20 2052  BP: (!) 124/53 (!) 119/46 (!) 121/49 (!) 92/36  Pulse: 80 79 83 85  Resp: 10 16 10  (!) 8  Temp: (!) 97.4 F (36.3 C) 97.6 F (36.4 C) 97.6 F (36.4 C) 97.7 F (36.5 C)  TempSrc: Oral Oral Oral Axillary  SpO2: 96% (!) 89% (!) 89% (!) 89%  Weight:        Intake/Output Summary (Last 24 hours) at 05/20/2020 1446 Last data filed at 05/20/2020 0300 Gross per 24 hour  Intake 358.64 ml  Output --  Net 358.64 ml   Filed Weights   05-19-20 0800  Weight: 50 kg    Physical  Exam:  General exam: sleeping comfortably, no acute distress Respiratory system: agonal breathing, upper airway secretion sounds, normal respiratory effort. Cardiovascular system: normal S1/S2, RRR, no edema Gastrointestinal system: soft, NT, ND   Labs   Data Reviewed: I have personally reviewed following labs and imaging studies  CBC: Recent Labs  Lab 04/21/2020 0928  WBC 12.0*  HGB 2.6*  HCT 10.2*  MCV 59.3*  PLT 375   Basic Metabolic Panel: Recent Labs  Lab 05/01/2020 0817  NA 142  K 4.7  CL 110  CO2 8*  GLUCOSE 102*  BUN 30*  CREATININE 1.83*  CALCIUM 9.5   GFR: Estimated Creatinine Clearance: 19.4 mL/min (A) (by C-G formula based on SCr of 1.83 mg/dL (H)). Liver Function Tests: Recent Labs  Lab 04/21/2020 0817  AST 50*  ALT 24  ALKPHOS 47  BILITOT 0.8  PROT 7.9  ALBUMIN 3.4*   No results for input(s): LIPASE, AMYLASE  in the last 168 hours. No results for input(s): AMMONIA in the last 168 hours. Coagulation Profile: No results for input(s): INR, PROTIME in the last 168 hours. Cardiac Enzymes: No results for input(s): CKTOTAL, CKMB, CKMBINDEX, TROPONINI in the last 168 hours. BNP (last 3 results) No results for input(s): PROBNP in the last 8760 hours. HbA1C: No results for input(s): HGBA1C in the last 72 hours. CBG: No results for input(s): GLUCAP in the last 168 hours. Lipid Profile: No results for input(s): CHOL, HDL, LDLCALC, TRIG, CHOLHDL, LDLDIRECT in the last 72 hours. Thyroid Function Tests: No results for input(s): TSH, T4TOTAL, FREET4, T3FREE, THYROIDAB in the last 72 hours. Anemia Panel: No results for input(s): VITAMINB12, FOLATE, FERRITIN, TIBC, IRON, RETICCTPCT in the last 72 hours. Sepsis Labs: Recent Labs  Lab 05/13/2020 0817 05/14/2020 0954 04/26/2020 1134  PROCALCITON 0.47  --   --   LATICACIDVEN  --  >11.0* >11.0*    Recent Results (from the past 240 hour(s))  Blood culture (routine x 2)     Status: None (Preliminary result)   Collection Time: 05/07/2020  9:28 AM   Specimen: BLOOD  Result Value Ref Range Status   Specimen Description BLOOD BLOOD RIGHT HAND  Final   Special Requests   Final    BOTTLES DRAWN AEROBIC AND ANAEROBIC Blood Culture adequate volume   Culture   Final    NO GROWTH 4 DAYS Performed at [facility], [address], [place], [place] [address]    Report Status PENDING  Incomplete  Urine culture     Status: Abnormal   Collection Time: 05/05/2020 10:17 AM   Specimen: Urine, Random  Result Value Ref Range Status   Specimen Description   Final    URINE, RANDOM Performed at [facility], [address], [place], [place] [address]    Special Requests   Final    NONE Performed at [facility], [address] [place], [place] [address]    Culture >=100,000 COLONIES/mL ESCHERICHIA COLI (A)  Final   Report Status 05/20/2020 FINAL   Final   Organism ID, Bacteria ESCHERICHIA COLI (A)  Final      Susceptibility   Escherichia coli - MIC*    AMPICILLIN <=2 SENSITIVE Sensitive     CEFAZOLIN <=4 SENSITIVE Sensitive     CEFTRIAXONE <=0.25 SENSITIVE Sensitive     CIPROFLOXACIN <=0.25 SENSITIVE Sensitive     GENTAMICIN <=1 SENSITIVE Sensitive     IMIPENEM <=0.25 SENSITIVE Sensitive     NITROFURANTOIN <=16 SENSITIVE Sensitive     TRIMETH/SULFA <=  20 SENSITIVE Sensitive     AMPICILLIN/SULBACTAM <=2 SENSITIVE Sensitive     * >=100,000 COLONIES/mL ESCHERICHIA COLI  SARS Coronavirus 2 by RT PCR (hospital order, performed in [facility] hospital lab) Nasopharyngeal Nasopharyngeal Swab     Status: None   Collection Time: 04/18/2020 11:28 AM   Specimen: Nasopharyngeal Swab  Result Value Ref Range Status   SARS Coronavirus 2 NEGATIVE NEGATIVE Final    Comment: (NOTE) SARS-CoV-2 target nucleic acids are NOT DETECTED.  The SARS-CoV-2 RNA is generally detectable in upper and lower respiratory specimens during the acute phase of infection. The lowest concentration of SARS-CoV-2 viral copies this assay can detect is 250 copies / mL. A negative result does not preclude SARS-CoV-2 infection and should not be used as the sole basis for treatment or other patient management decisions.  A negative result may occur with improper specimen collection / handling, submission of specimen other than nasopharyngeal swab, presence of viral mutation(s) within the areas targeted by this assay, and inadequate number of viral copies (<250 copies / mL). A negative result must be combined with clinical observations, patient history, and epidemiological information.  Fact Sheet for Patients:   BoilerBrush.com.cy  Fact Sheet for Healthcare Providers: https://pope.com/  This test is not yet approved or  cleared by the [place] FDA and has been authorized for detection and/or diagnosis of SARS-CoV-2  by FDA under an Emergency Use Authorization (EUA).  This EUA will remain in effect (meaning this test can be used) for the duration of the COVID-19 declaration under Section 564(b)(1) of the Act, 21 U.S.C. section 360bbb-3(b)(1), unless the authorization is terminated or revoked sooner.  Performed at [facility], [address] [place], [place] [address]       Imaging Studies   No results found.   Medications   Scheduled Meds: Continuous Infusions: . sodium chloride    . morphine 5 mg/hr (05/20/20 0300)  . sodium chloride         LOS: 4 days    Time spent: 15 minutes    Pennie Banter, DO [facility]  05/20/2020, 2:46 PM    If 7PM-7AM, please contact night-coverage. How to contact the [facility] Attending or Consulting provider 7A - 7P or covering provider during after hours 7P -7A, for this patient?    1. Check the care team in [facility] and look for a) attending/consulting TRH provider listed and b) the [facility] team listed 2. Log into [email or web address] and use [place]'s universal password to access. If you do not have the password, please contact the hospital operator. 3. Locate the [facility] provider you are looking for under [facility] and page to a number that you can be directly reached. 4. If you still have difficulty reaching the provider, please page the [facility] (Director on Call) for the Hospitalists listed on amion for assistance.

## 2020-05-21 LAB — CULTURE, BLOOD (ROUTINE X 2)
Culture: NO GROWTH
Special Requests: ADEQUATE

## 2020-05-21 NOTE — Progress Notes (Signed)
PROGRESS NOTE    Lacey Ray   [id number]:[id number]  DOB: 1941/06/21  PCP: Tarri Fuller, FNP    DOA: May 28, 2020 LOS: 5   Brief Narrative   79 y.o. female  with past medical history of HTN and seizures admitted on May 28, 2020 with seizure. Found to have severe anemia and severe acidosis. Patient unresponsive with severe respiratory distress, increased WOB with accessory muscle use, in addition to hypothermia.  Patient remained unresponsive in ICU, with ongoing respiratory distress and progressive multiorgan system failure and very low chance of meaningful recovery.    Discussion was had between patient's family and intensivist, Dr. Belia Heman. Family consented to full comfort care.     Assessment & Plan   Principal Problem:   Comfort measures only status Active Problems:   Acidosis, lactic   DNR (do not resuscitate)   Sepsis with encephalopathy and septic shock (HCC)   Comfort Care Status - continue comfort care per orders including morphine infusion.  Notify MD if any signs of distress or discomfort despite comfort medications.    Sepsis with encephalopathy and septic shock - present on admission. Lactic acidosis - present on admission. Due to patient's severity of illness on arrival, and transition to comfort care, further evaluation and treatment for sepsis and lactic acidosis were not pursued.    Patient BMI: Body mass index is 20.16 kg/m.   DVT prophylaxis: SCDs Start: May 28, 2020 1128   Diet:  Diet Orders (From admission, onward)    Start     Ordered   05-28-20 1128  Diet NPO time specified  Diet effective now        05/28/20 1128            Code Status: DNR    Subjective 05/21/20    Patient seen at bedside, no family present.  She is sleeping comfortably on her side, appears to have no signs of distress.  Unresponsive to voice and tactile stimulus.  No acute events.   Respiratory rate counted at 4.   Disposition Plan & Communication   Status is:  Inpatient  Remains inpatient appropriate because: patient is actively dying and not stable for transport to hospice.  Anticipate imminent in hospital death.   Dispo: The patient is from: Home              Anticipated d/c is to: Funeral home              Anticipated d/c date is: 1 day              Patient currently is not medically stable to d/c.   Family Communication: none at bedside, will attempt to call.    Consults, Procedures, Significant Events   Consultants:   Palliative care  Procedures:   None  Antimicrobials:   none    Objective   Vitals:   05/19/20 0815 05/19/20 2052 05/20/20 1655 05/21/20 1201  BP: (!) 121/49 (!) 92/36 (!) 115/39 108/83  Pulse: 83 85 81 (!) 180  Resp: 10 (!) 8 10 14   Temp: 97.6 F (36.4 C) 97.7 F (36.5 C) 98.2 F (36.8 C) 98.5 F (36.9 C)  TempSrc: Oral Axillary Oral   SpO2: (!) 89% (!) 89% (!) 84% 95%  Weight:       No intake or output data in the 24 hours ending 05/21/20 1718 Filed Weights   May 28, 2020 0800  Weight: 50 kg    Physical Exam:  General exam: sleeping comfortably, no acute distress Respiratory system: agonal  breathing, upper airway secretion sounds, normal respiratory effort. Cardiovascular system: normal S1/S2, RRR, no edema Gastrointestinal system: soft, NT, ND   Labs   Data Reviewed: I have personally reviewed following labs and imaging studies  CBC: Recent Labs  Lab 05/20/2020 0928  WBC 12.0*  HGB 2.6*  HCT 10.2*  MCV 59.3*  PLT 375   Basic Metabolic Panel: Recent Labs  Lab 06/13/2020 0817  NA 142  K 4.7  CL 110  CO2 8*  GLUCOSE 102*  BUN 30*  CREATININE 1.83*  CALCIUM 9.5   GFR: Estimated Creatinine Clearance: 19.4 mL/min (A) (by C-G formula based on SCr of 1.83 mg/dL (H)). Liver Function Tests: Recent Labs  Lab May 26, 2020 0817  AST 50*  ALT 24  ALKPHOS 47  BILITOT 0.8  PROT 7.9  ALBUMIN 3.4*   No results for input(s): LIPASE, AMYLASE in the last 168 hours. No results for  input(s): AMMONIA in the last 168 hours. Coagulation Profile: No results for input(s): INR, PROTIME in the last 168 hours. Cardiac Enzymes: No results for input(s): CKTOTAL, CKMB, CKMBINDEX, TROPONINI in the last 168 hours. BNP (last 3 results) No results for input(s): PROBNP in the last 8760 hours. HbA1C: No results for input(s): HGBA1C in the last 72 hours. CBG: No results for input(s): GLUCAP in the last 168 hours. Lipid Profile: No results for input(s): CHOL, HDL, LDLCALC, TRIG, CHOLHDL, LDLDIRECT in the last 72 hours. Thyroid Function Tests: No results for input(s): TSH, T4TOTAL, FREET4, T3FREE, THYROIDAB in the last 72 hours. Anemia Panel: No results for input(s): VITAMINB12, FOLATE, FERRITIN, TIBC, IRON, RETICCTPCT in the last 72 hours. Sepsis Labs: Recent Labs  Lab 05/28/2020 0817 May 26, 2020 0954 05/25/2020 1134  PROCALCITON 0.47  --   --   LATICACIDVEN  --  >11.0* >11.0*    Recent Results (from the past 240 hour(s))  Blood culture (routine x 2)     Status: None   Collection Time: 05/21/2020  9:28 AM   Specimen: BLOOD  Result Value Ref Range Status   Specimen Description BLOOD BLOOD RIGHT HAND  Final   Special Requests   Final    BOTTLES DRAWN AEROBIC AND ANAEROBIC Blood Culture adequate volume   Culture   Final    NO GROWTH 5 DAYS Performed at [facility], [address] [place], [place] [address]    Report Status 05/21/2020 FINAL  Final  Urine culture     Status: Abnormal   Collection Time: 05/25/2020 10:17 AM   Specimen: Urine, Random  Result Value Ref Range Status   Specimen Description   Final    URINE, RANDOM Performed at [facility], [address], [place], [place] [address]    Special Requests   Final    NONE Performed at [facility], [address] [place], [place] [address]    Culture >=100,000 COLONIES/mL ESCHERICHIA COLI (A)  Final   Report Status 05/20/2020 FINAL  Final   Organism ID, Bacteria ESCHERICHIA COLI (A)   Final      Susceptibility   Escherichia coli - MIC*    AMPICILLIN <=2 SENSITIVE Sensitive     CEFAZOLIN <=4 SENSITIVE Sensitive     CEFTRIAXONE <=0.25 SENSITIVE Sensitive     CIPROFLOXACIN <=0.25 SENSITIVE Sensitive     GENTAMICIN <=1 SENSITIVE Sensitive     IMIPENEM <=0.25 SENSITIVE Sensitive     NITROFURANTOIN <=16 SENSITIVE Sensitive     TRIMETH/SULFA <=20 SENSITIVE Sensitive     AMPICILLIN/SULBACTAM <=2 SENSITIVE Sensitive     * >=  100,000 COLONIES/mL ESCHERICHIA COLI  SARS Coronavirus 2 by RT PCR (hospital order, performed in [facility] hospital lab) Nasopharyngeal Nasopharyngeal Swab     Status: None   Collection Time: 04/29/2020 11:28 AM   Specimen: Nasopharyngeal Swab  Result Value Ref Range Status   SARS Coronavirus 2 NEGATIVE NEGATIVE Final    Comment: (NOTE) SARS-CoV-2 target nucleic acids are NOT DETECTED.  The SARS-CoV-2 RNA is generally detectable in upper and lower respiratory specimens during the acute phase of infection. The lowest concentration of SARS-CoV-2 viral copies this assay can detect is 250 copies / mL. A negative result does not preclude SARS-CoV-2 infection and should not be used as the sole basis for treatment or other patient management decisions.  A negative result may occur with improper specimen collection / handling, submission of specimen other than nasopharyngeal swab, presence of viral mutation(s) within the areas targeted by this assay, and inadequate number of viral copies (<250 copies / mL). A negative result must be combined with clinical observations, patient history, and epidemiological information.  Fact Sheet for Patients:   BoilerBrush.com.cy  Fact Sheet for Healthcare Providers: https://pope.com/  This test is not yet approved or  cleared by the [place] FDA and has been authorized for detection and/or diagnosis of SARS-CoV-2 by FDA under an Emergency Use Authorization (EUA).   This EUA will remain in effect (meaning this test can be used) for the duration of the COVID-19 declaration under Section 564(b)(1) of the Act, 21 U.S.C. section 360bbb-3(b)(1), unless the authorization is terminated or revoked sooner.  Performed at [facility], [address] [place], [place] [address]       Imaging Studies   No results found.   Medications   Scheduled Meds: Continuous Infusions: . sodium chloride    . morphine 6 mg/hr (05/21/20 1232)  . sodium chloride         LOS: 5 days    Time spent: 15 minutes    Pennie Banter, DO [facility]  05/21/2020, 5:18 PM    If 7PM-7AM, please contact night-coverage. How to contact the [facility] Attending or Consulting provider 7A - 7P or covering provider during after hours 7P -7A, for this patient?    1. Check the care team in [facility] and look for a) attending/consulting TRH provider listed and b) the [facility] team listed 2. Log into [email or web address] and use [place]'s universal password to access. If you do not have the password, please contact the hospital operator. 3. Locate the [facility] provider you are looking for under [facility] and page to a number that you can be directly reached. 4. If you still have difficulty reaching the provider, please page the [facility] (Director on Call) for the Hospitalists listed on amion for assistance.

## 2020-05-22 DIAGNOSIS — Z515 Encounter for palliative care: Secondary | ICD-10-CM

## 2020-05-22 LAB — PREPARE RBC (CROSSMATCH)

## 2020-05-22 NOTE — Progress Notes (Signed)
PROGRESS NOTE    Lacey Ray   BCW:[id number]  DOB: 08-20-1941  PCP: Tarri Fuller, FNP    DOA: 04/30/2020 LOS: 6   Brief Narrative   79 y.o. female  with past medical history of HTN and seizures admitted on 05/10/2020 with seizure. Found to have severe anemia and severe acidosis. Patient unresponsive with severe respiratory distress, increased WOB with accessory muscle use, in addition to hypothermia.  Patient remained unresponsive in ICU, with ongoing respiratory distress and progressive multiorgan system failure and very low chance of meaningful recovery.    Discussion was had between patient's family and intensivist, Dr. Belia Heman. Family consented to full comfort care.     Assessment & Plan   Principal Problem:   Comfort measures only status Active Problems:   Acidosis, lactic   DNR (do not resuscitate)   Sepsis with encephalopathy and septic shock [facility])   Palliative care by specialist   End of life care   Comfort Care Status - continue comfort care per orders including morphine infusion.  Notify MD if any signs of distress or discomfort despite comfort medications.    Sepsis with encephalopathy and septic shock - present on admission. Lactic acidosis - present on admission. Due to patient's severity of illness on arrival, and transition to comfort care, further evaluation and treatment for sepsis and lactic acidosis were not pursued.    Patient BMI: Body mass index is 20.16 kg/m.   DVT prophylaxis: SCDs Start: 05/02/2020 1128   Diet:  Diet Orders (From admission, onward)    Start     Ordered   05/12/2020 1128  Diet NPO time specified  Diet effective now        05/07/2020 1128            Code Status: DNR    Subjective 05/22/20    Patient seen at bedside, no family present.  Sleeping comfortably.  No signs of distress or discomfort.  Unresponsive to voice and tactile stimulus.        Disposition Plan & Communication   Status is: Inpatient  Remains  inpatient appropriate because: patient is actively dying and not stable for transport to hospice.  Anticipate imminent in hospital death.   Dispo: The patient is from: Home              Anticipated d/c is to: Funeral home              Anticipated d/c date is: hours to days              Patient currently is not medically stable to d/c.   Family Communication: none at bedside, will attempt to call.    Consults, Procedures, Significant Events   Consultants:   Palliative care  Procedures:   None  Antimicrobials:   none    Objective   Vitals:   05/19/20 0815 05/19/20 2052 05/20/20 1655 05/21/20 1201  BP: (!) 121/49 (!) 92/36 (!) 115/39 108/83  Pulse: 83 85 81 (!) 180  Resp: 10 (!) 8 10 14   Temp: 97.6 F (36.4 C) 97.7 F (36.5 C) 98.2 F (36.8 C) 98.5 F (36.9 C)  TempSrc: Oral Axillary Oral   SpO2: (!) 89% (!) 89% (!) 84% 95%  Weight:       No intake or output data in the 24 hours ending 05/22/20 1548 Filed Weights   04/23/2020 0800  Weight: 50 kg    Physical Exam:  General exam: sleeping comfortably, no acute distress Respiratory  system: agonal breathing, upper airway secretion sounds, normal respiratory effort. Cardiovascular system: normal S1/S2, RRR, no edema Gastrointestinal system: soft, NT, ND   Labs   Data Reviewed: I have personally reviewed following labs and imaging studies  CBC: Recent Labs  Lab 05/17/2020 0928  WBC 12.0*  HGB 2.6*  HCT 10.2*  MCV 59.3*  PLT 375   Basic Metabolic Panel: Recent Labs  Lab 04/29/2020 0817  NA 142  K 4.7  CL 110  CO2 8*  GLUCOSE 102*  BUN 30*  CREATININE 1.83*  CALCIUM 9.5   GFR: Estimated Creatinine Clearance: 19.4 mL/min (A) (by C-G formula based on SCr of 1.83 mg/dL (H)). Liver Function Tests: Recent Labs  Lab 05/10/2020 0817  AST 50*  ALT 24  ALKPHOS 47  BILITOT 0.8  PROT 7.9  ALBUMIN 3.4*   No results for input(s): LIPASE, AMYLASE in the last 168 hours. No results for input(s): AMMONIA  in the last 168 hours. Coagulation Profile: No results for input(s): INR, PROTIME in the last 168 hours. Cardiac Enzymes: No results for input(s): CKTOTAL, CKMB, CKMBINDEX, TROPONINI in the last 168 hours. BNP (last 3 results) No results for input(s): PROBNP in the last 8760 hours. HbA1C: No results for input(s): HGBA1C in the last 72 hours. CBG: No results for input(s): GLUCAP in the last 168 hours. Lipid Profile: No results for input(s): CHOL, HDL, LDLCALC, TRIG, CHOLHDL, LDLDIRECT in the last 72 hours. Thyroid Function Tests: No results for input(s): TSH, T4TOTAL, FREET4, T3FREE, THYROIDAB in the last 72 hours. Anemia Panel: No results for input(s): VITAMINB12, FOLATE, FERRITIN, TIBC, IRON, RETICCTPCT in the last 72 hours. Sepsis Labs: Recent Labs  Lab 05/05/2020 0817 05/07/2020 0954 04/28/2020 1134  PROCALCITON 0.47  --   --   LATICACIDVEN  --  >11.0* >11.0*    Recent Results (from the past 240 hour(s))  Blood culture (routine x 2)     Status: None   Collection Time: 04/25/2020  9:28 AM   Specimen: BLOOD  Result Value Ref Range Status   Specimen Description BLOOD BLOOD RIGHT HAND  Final   Special Requests   Final    BOTTLES DRAWN AEROBIC AND ANAEROBIC Blood Culture adequate volume   Culture   Final    NO GROWTH 5 DAYS Performed at [facility], [address] [place], [place] [address]    Report Status 05/21/2020 FINAL  Final  Urine culture     Status: Abnormal   Collection Time: 04/25/2020 10:17 AM   Specimen: Urine, Random  Result Value Ref Range Status   Specimen Description   Final    URINE, RANDOM Performed at [facility], [address], [place], [place] [address]    Special Requests   Final    NONE Performed at [facility], [address] [place], [place] [address]    Culture >=100,000 COLONIES/mL ESCHERICHIA COLI (A)  Final   Report Status 05/20/2020 FINAL  Final   Organism ID, Bacteria ESCHERICHIA COLI (A)  Final       Susceptibility   Escherichia coli - MIC*    AMPICILLIN <=2 SENSITIVE Sensitive     CEFAZOLIN <=4 SENSITIVE Sensitive     CEFTRIAXONE <=0.25 SENSITIVE Sensitive     CIPROFLOXACIN <=0.25 SENSITIVE Sensitive     GENTAMICIN <=1 SENSITIVE Sensitive     IMIPENEM <=0.25 SENSITIVE Sensitive     NITROFURANTOIN <=16 SENSITIVE Sensitive     TRIMETH/SULFA <=20 SENSITIVE Sensitive     AMPICILLIN/SULBACTAM <=2 SENSITIVE Sensitive     * >=  100,000 COLONIES/mL ESCHERICHIA COLI  SARS Coronavirus 2 by RT PCR (hospital order, performed in [facility] hospital lab) Nasopharyngeal Nasopharyngeal Swab     Status: None   Collection Time: 2020-05-18 11:28 AM   Specimen: Nasopharyngeal Swab  Result Value Ref Range Status   SARS Coronavirus 2 NEGATIVE NEGATIVE Final    Comment: (NOTE) SARS-CoV-2 target nucleic acids are NOT DETECTED.  The SARS-CoV-2 RNA is generally detectable in upper and lower respiratory specimens during the acute phase of infection. The lowest concentration of SARS-CoV-2 viral copies this assay can detect is 250 copies / mL. A negative result does not preclude SARS-CoV-2 infection and should not be used as the sole basis for treatment or other patient management decisions.  A negative result may occur with improper specimen collection / handling, submission of specimen other than nasopharyngeal swab, presence of viral mutation(s) within the areas targeted by this assay, and inadequate number of viral copies (<250 copies / mL). A negative result must be combined with clinical observations, patient history, and epidemiological information.  Fact Sheet for Patients:   BoilerBrush.com.cy  Fact Sheet for Healthcare Providers: https://pope.com/  This test is not yet approved or  cleared by the [place] FDA and has been authorized for detection and/or diagnosis of SARS-CoV-2 by FDA under an Emergency Use Authorization (EUA).  This EUA will  remain in effect (meaning this test can be used) for the duration of the COVID-19 declaration under Section 564(b)(1) of the Act, 21 U.S.C. section 360bbb-3(b)(1), unless the authorization is terminated or revoked sooner.  Performed at [facility], [address], [place], [place] [address]       Imaging Studies   No results found.   Medications   Scheduled Meds: Continuous Infusions: . sodium chloride    . morphine 6 mg/hr (05/22/20 0226)  . sodium chloride         LOS: 6 days    Time spent: 15 minutes    Pennie Banter, DO [facility]  05/22/2020, 3:48 PM    If 7PM-7AM, please contact night-coverage. How to contact the [facility] Attending or Consulting provider 7A - 7P or covering provider during after hours 7P -7A, for this patient?    1. Check the care team in [facility] and look for a) attending/consulting TRH provider listed and b) the [facility] team listed 2. Log into [email or web address] and use [place]'s universal password to access. If you do not have the password, please contact the hospital operator. 3. Locate the [facility] provider you are looking for under [facility] and page to a number that you can be directly reached. 4. If you still have difficulty reaching the provider, please page the [facility] (Director on Call) for the Hospitalists listed on amion for assistance.

## 2020-05-22 NOTE — Progress Notes (Signed)
Palliative: [name] is full comfort care.  She is resting quietly in bed, appears comfortable.  She is actively dying.  There is no family at bedside at this time.  Conference with bedside nursing staff related to patient condition, needs, symptom management.  Plan: Full comfort care, anticipate hospital death. Prognosis: Actively dying, Hours to days.  No charge Lacey Carmel, NP Palliative medicine team Team phone [phone] Greater than 50% of this time was spent counseling and coordinating care related to the above assessment and plan.

## 2020-05-23 NOTE — Progress Notes (Signed)
Palliative:  Lacey Ray is FULL COMFORT care.  She is actively dying.  Lacey Ray does not respond to voice or touch, is unable to make her basic needs know.  There is no family at bedside at this time.   Lacey Ray is comfortable with morphine infusion.  She is not stable to transport to hospice home. Anticipate hours of life and in hospital death.   Conference with attending, bedside nursing staff and [facility] team related to patient condition, needs, symptom management.   Plan:  FULL COMFORT care, anticipate hospital death.  Prognosis: Actively dying, hours. Not stable to transport  15 minutes  [name], NP Palliative Medicine Team Team Phone [phone]  Greater than 50% of this time was spent counseling and coordinating care related to the above assessment and plan.

## 2020-05-23 NOTE — Progress Notes (Signed)
PROGRESS NOTE    Lacey Ray   LXB:[id number]  DOB: 01-May-1941  PCP: Tarri Fuller, FNP    DOA: June 15, 2020 LOS: 7   Brief Narrative   79 y.o. female  with past medical history of HTN and seizures admitted on 2020-06-15 with seizure. Found to have severe anemia and severe acidosis. Patient unresponsive with severe respiratory distress, increased WOB with accessory muscle use, in addition to hypothermia.  Patient remained unresponsive in ICU, with ongoing respiratory distress and progressive multiorgan system failure and very low chance of meaningful recovery.    Discussion was had between patient's family and intensivist, Dr. Belia Heman. Family consented to full comfort care.     Assessment & Plan   Principal Problem:   Comfort measures only status Active Problems:   Acidosis, lactic   DNR (do not resuscitate)   Sepsis with encephalopathy and septic shock [facility])   Palliative care by specialist   End of life care   Comfort Care Status - continue comfort care per orders including morphine infusion.  Notify MD if any signs of distress or discomfort despite comfort medications.    Sepsis with encephalopathy and septic shock - present on admission. Lactic acidosis - present on admission. Due to patient's severity of illness on arrival, and transition to comfort care, further evaluation and treatment for sepsis and lactic acidosis were not pursued.    Patient BMI: Body mass index is 20.16 kg/m.   DVT prophylaxis: SCDs Start: June 15, 2020 1128   Diet:  Diet Orders (From admission, onward)    Start     Ordered   Jun 15, 2020 1128  Diet NPO time specified  Diet effective now        06-15-2020 1128            Code Status: DNR    Subjective 05/23/20    Patient seen at bedside, no family present.  Sleeping comfortably.  No signs of distress or discomfort.  Unresponsive to voice and tactile stimulus.        Disposition Plan & Communication   Status is: Inpatient  Remains  inpatient appropriate because: patient is actively dying and not stable for transport to hospice.  Anticipate imminent in hospital death.   Dispo: The patient is from: Home              Anticipated d/c is to: Funeral home              Anticipated d/c date is: hours to days              Patient currently is not medically stable to d/c.   Family Communication: none at bedside, will attempt to call.    Consults, Procedures, Significant Events   Consultants:   Palliative care  Procedures:   None  Antimicrobials:   none    Objective   Vitals:   05/19/20 0815 05/19/20 2052 05/20/20 1655 05/21/20 1201  BP: (!) 121/49 (!) 92/36 (!) 115/39 108/83  Pulse: 83 85 81 (!) 180  Resp: 10 (!) 8 10 14   Temp: 97.6 F (36.4 C) 97.7 F (36.5 C) 98.2 F (36.8 C) 98.5 F (36.9 C)  TempSrc: Oral Axillary Oral   SpO2: (!) 89% (!) 89% (!) 84% 95%  Weight:       No intake or output data in the 24 hours ending 05/23/20 1656 Filed Weights   06-15-2020 0800  Weight: 50 kg    Physical Exam:  General exam: sleeping comfortably, no acute distress Respiratory  system: agonal breathing, upper airway secretion sounds, normal respiratory effort. Cardiovascular system: normal S1/S2, RRR, no edema Gastrointestinal system: soft, NT, ND   Labs   Data Reviewed: I have personally reviewed following labs and imaging studies  CBC: No results for input(s): WBC, NEUTROABS, HGB, HCT, MCV, PLT in the last 168 hours. Basic Metabolic Panel: No results for input(s): NA, K, CL, CO2, GLUCOSE, BUN, CREATININE, CALCIUM, MG, PHOS in the last 168 hours. GFR: Estimated Creatinine Clearance: 19.4 mL/min (A) (by C-G formula based on SCr of 1.83 mg/dL (H)). Liver Function Tests: No results for input(s): AST, ALT, ALKPHOS, BILITOT, PROT, ALBUMIN in the last 168 hours. No results for input(s): LIPASE, AMYLASE in the last 168 hours. No results for input(s): AMMONIA in the last 168 hours. Coagulation Profile: No  results for input(s): INR, PROTIME in the last 168 hours. Cardiac Enzymes: No results for input(s): CKTOTAL, CKMB, CKMBINDEX, TROPONINI in the last 168 hours. BNP (last 3 results) No results for input(s): PROBNP in the last 8760 hours. HbA1C: No results for input(s): HGBA1C in the last 72 hours. CBG: No results for input(s): GLUCAP in the last 168 hours. Lipid Profile: No results for input(s): CHOL, HDL, LDLCALC, TRIG, CHOLHDL, LDLDIRECT in the last 72 hours. Thyroid Function Tests: No results for input(s): TSH, T4TOTAL, FREET4, T3FREE, THYROIDAB in the last 72 hours. Anemia Panel: No results for input(s): VITAMINB12, FOLATE, FERRITIN, TIBC, IRON, RETICCTPCT in the last 72 hours. Sepsis Labs: No results for input(s): PROCALCITON, LATICACIDVEN in the last 168 hours.  Recent Results (from the past 240 hour(s))  Blood culture (routine x 2)     Status: None   Collection Time: 05/06/2020  9:28 AM   Specimen: BLOOD  Result Value Ref Range Status   Specimen Description BLOOD BLOOD RIGHT HAND  Final   Special Requests   Final    BOTTLES DRAWN AEROBIC AND ANAEROBIC Blood Culture adequate volume   Culture   Final    NO GROWTH 5 DAYS Performed at [facility], [address] [place], [place] [address]    Report Status 05/21/2020 FINAL  Final  Urine culture     Status: Abnormal   Collection Time: 04/29/2020 10:17 AM   Specimen: Urine, Random  Result Value Ref Range Status   Specimen Description   Final    URINE, RANDOM Performed at [facility], [address], [place], [place] [address]    Special Requests   Final    NONE Performed at [facility], [address] [place], [place] [address]    Culture >=100,000 COLONIES/mL ESCHERICHIA COLI (A)  Final   Report Status 05/20/2020 FINAL  Final   Organism ID, Bacteria ESCHERICHIA COLI (A)  Final      Susceptibility   Escherichia coli - MIC*    AMPICILLIN <=2 SENSITIVE Sensitive     CEFAZOLIN <=4 SENSITIVE  Sensitive     CEFTRIAXONE <=0.25 SENSITIVE Sensitive     CIPROFLOXACIN <=0.25 SENSITIVE Sensitive     GENTAMICIN <=1 SENSITIVE Sensitive     IMIPENEM <=0.25 SENSITIVE Sensitive     NITROFURANTOIN <=16 SENSITIVE Sensitive     TRIMETH/SULFA <=20 SENSITIVE Sensitive     AMPICILLIN/SULBACTAM <=2 SENSITIVE Sensitive     * >=100,000 COLONIES/mL ESCHERICHIA COLI  SARS Coronavirus 2 by RT PCR (hospital order, performed in [facility] hospital lab) Nasopharyngeal Nasopharyngeal Swab     Status: None   Collection Time: 05/15/2020 11:28 AM   Specimen: Nasopharyngeal Swab  Result Value Ref  Range Status   SARS Coronavirus 2 NEGATIVE NEGATIVE Final    Comment: (NOTE) SARS-CoV-2 target nucleic acids are NOT DETECTED.  The SARS-CoV-2 RNA is generally detectable in upper and lower respiratory specimens during the acute phase of infection. The lowest concentration of SARS-CoV-2 viral copies this assay can detect is 250 copies / mL. A negative result does not preclude SARS-CoV-2 infection and should not be used as the sole basis for treatment or other patient management decisions.  A negative result may occur with improper specimen collection / handling, submission of specimen other than nasopharyngeal swab, presence of viral mutation(s) within the areas targeted by this assay, and inadequate number of viral copies (<250 copies / mL). A negative result must be combined with clinical observations, patient history, and epidemiological information.  Fact Sheet for Patients:   BoilerBrush.com.cy  Fact Sheet for Healthcare Providers: https://pope.com/  This test is not yet approved or  cleared by the [place] FDA and has been authorized for detection and/or diagnosis of SARS-CoV-2 by FDA under an Emergency Use Authorization (EUA).  This EUA will remain in effect (meaning this test can be used) for the duration of the COVID-19 declaration under Section  564(b)(1) of the Act, 21 U.S.C. section 360bbb-3(b)(1), unless the authorization is terminated or revoked sooner.  Performed at [facility], [address], [place], [place] [address]       Imaging Studies   No results found.   Medications   Scheduled Meds: Continuous Infusions:  sodium chloride     morphine 6 mg/hr (05/23/20 1059)   sodium chloride         LOS: 7 days    Time spent: 15 minutes    Pennie Banter, DO [facility]  05/23/2020, 4:56 PM    If 7PM-7AM, please contact night-coverage. How to contact the [facility] Attending or Consulting provider 7A - 7P or covering provider during after hours 7P -7A, for this patient?    1. Check the care team in [facility] and look for a) attending/consulting TRH provider listed and b) the [facility] team listed 2. Log into [email or web address] and use [place]'s universal password to access. If you do not have the password, please contact the hospital operator. 3. Locate the [facility] provider you are looking for under [facility] and page to a number that you can be directly reached. 4. If you still have difficulty reaching the provider, please page the [facility] (Director on Call) for the Hospitalists listed on amion for assistance.

## 2020-05-24 NOTE — Progress Notes (Signed)
Palliative: Lacey Ray is full comfort care and is actively dying.  She has been actively dying for the last several days.  She appears comfortable.  There is no family at bedside at this time.  Prognostication can be difficult.  Conference with attending, bedside nursing staff, transition of care team related to patient condition, needs.    Plan: Full comfort care, actively dying.  Too unstable for transport.  No charge Lacey Carmel, NP Palliative medicine team Team phone [phone]  Greater than 50% of this time was spent counseling and coordinating care related to the above assessment and plan.

## 2020-05-24 NOTE — Progress Notes (Signed)
PROGRESS NOTE    Lacey Ray   KGU:[id number]  DOB: 06-18-1941  PCP: Tarri Fuller, FNP    DOA: 04/22/2020 LOS: 8   Brief Narrative   79 y.o. female  with past medical history of HTN and seizures admitted on 04/19/2020 with seizure. Found to have severe anemia and severe acidosis. Patient unresponsive with severe respiratory distress, increased WOB with accessory muscle use, in addition to hypothermia.  Patient remained unresponsive in ICU, with ongoing respiratory distress and progressive multiorgan system failure and very low chance of meaningful recovery.    Discussion was had between patient's family and intensivist, Dr. Belia Heman. Family consented to full comfort care.     Assessment & Plan   Principal Problem:   Comfort measures only status Active Problems:   Acidosis, lactic   DNR (do not resuscitate)   Sepsis with encephalopathy and septic shock [facility])   Palliative care by specialist   End of life care   Comfort Care Status - continue comfort care per orders including morphine infusion.  Notify MD if any signs of distress or discomfort despite comfort medications.    Sepsis with encephalopathy and septic shock - present on admission. Lactic acidosis - present on admission. Due to patient's severity of illness on arrival, and transition to comfort care, further evaluation and treatment for sepsis and lactic acidosis were not pursued.    Patient BMI: Body mass index is 20.16 kg/m.   DVT prophylaxis: SCDs Start: 05/13/2020 1128   Diet:  Diet Orders (From admission, onward)    Start     Ordered   04/30/2020 1128  Diet NPO time specified  Diet effective now        05/01/2020 1128            Code Status: DNR    Subjective 05/24/20    Patient seen at bedside, no family present.  Sleeping comfortably.  No signs of distress or discomfort.  Unresponsive to voice and tactile stimulus.        Disposition Plan & Communication   Status is: Inpatient  Remains  inpatient appropriate because: patient is actively dying and not stable for transport to hospice.  Anticipate imminent in hospital death.   Dispo: The patient is from: Home              Anticipated d/c is to: Funeral home              Anticipated d/c date is: hours to days              Patient currently is not medically stable to d/c.   Family Communication: none at bedside, will attempt to call.    Consults, Procedures, Significant Events   Consultants:   Palliative care  Procedures:   None  Antimicrobials:   none    Objective   Vitals:   05/20/20 1655 05/21/20 1201 05/23/20 1952 05/24/20 0757  BP: (!) 115/39 108/83 (!) 109/41 (!) 97/32  Pulse: 81 (!) 180 94 81  Resp: 10 14  14   Temp: 98.2 F (36.8 C) 98.5 F (36.9 C) (!) 103.1 F (39.5 C) 99.2 F (37.3 C)  TempSrc: Oral  Oral   SpO2:  95% (!) 83% 91%  Weight:       No intake or output data in the 24 hours ending 05/24/20 1815 Filed Weights   04/26/2020 0800  Weight: 50 kg    Physical Exam:  General exam: sleeping comfortably, no acute distress Respiratory system: agonal  breathing, upper airway secretion sounds, normal respiratory effort. Cardiovascular system: normal S1/S2, RRR, no edema Gastrointestinal system: soft, NT, ND   Labs   Data Reviewed: I have personally reviewed following labs and imaging studies  CBC: No results for input(s): WBC, NEUTROABS, HGB, HCT, MCV, PLT in the last 168 hours. Basic Metabolic Panel: No results for input(s): NA, K, CL, CO2, GLUCOSE, BUN, CREATININE, CALCIUM, MG, PHOS in the last 168 hours. GFR: Estimated Creatinine Clearance: 19.4 mL/min (A) (by C-G formula based on SCr of 1.83 mg/dL (H)). Liver Function Tests: No results for input(s): AST, ALT, ALKPHOS, BILITOT, PROT, ALBUMIN in the last 168 hours. No results for input(s): LIPASE, AMYLASE in the last 168 hours. No results for input(s): AMMONIA in the last 168 hours. Coagulation Profile: No results for  input(s): INR, PROTIME in the last 168 hours. Cardiac Enzymes: No results for input(s): CKTOTAL, CKMB, CKMBINDEX, TROPONINI in the last 168 hours. BNP (last 3 results) No results for input(s): PROBNP in the last 8760 hours. HbA1C: No results for input(s): HGBA1C in the last 72 hours. CBG: No results for input(s): GLUCAP in the last 168 hours. Lipid Profile: No results for input(s): CHOL, HDL, LDLCALC, TRIG, CHOLHDL, LDLDIRECT in the last 72 hours. Thyroid Function Tests: No results for input(s): TSH, T4TOTAL, FREET4, T3FREE, THYROIDAB in the last 72 hours. Anemia Panel: No results for input(s): VITAMINB12, FOLATE, FERRITIN, TIBC, IRON, RETICCTPCT in the last 72 hours. Sepsis Labs: No results for input(s): PROCALCITON, LATICACIDVEN in the last 168 hours.  Recent Results (from the past 240 hour(s))  Blood culture (routine x 2)     Status: None   Collection Time: 05/09/2020  9:28 AM   Specimen: BLOOD  Result Value Ref Range Status   Specimen Description BLOOD BLOOD RIGHT HAND  Final   Special Requests   Final    BOTTLES DRAWN AEROBIC AND ANAEROBIC Blood Culture adequate volume   Culture   Final    NO GROWTH 5 DAYS Performed at [facility], [address] [place], [place] [address]    Report Status 05/21/2020 FINAL  Final  Urine culture     Status: Abnormal   Collection Time: 05/09/2020 10:17 AM   Specimen: Urine, Random  Result Value Ref Range Status   Specimen Description   Final    URINE, RANDOM Performed at [facility], [address] [place], [place] [address]    Special Requests   Final    NONE Performed at [facility], [address] [place], [place] [address]    Culture >=100,000 COLONIES/mL ESCHERICHIA COLI (A)  Final   Report Status 05/20/2020 FINAL  Final   Organism ID, Bacteria ESCHERICHIA COLI (A)  Final      Susceptibility   Escherichia coli - MIC*    AMPICILLIN <=2 SENSITIVE Sensitive     CEFAZOLIN <=4 SENSITIVE Sensitive      CEFTRIAXONE <=0.25 SENSITIVE Sensitive     CIPROFLOXACIN <=0.25 SENSITIVE Sensitive     GENTAMICIN <=1 SENSITIVE Sensitive     IMIPENEM <=0.25 SENSITIVE Sensitive     NITROFURANTOIN <=16 SENSITIVE Sensitive     TRIMETH/SULFA <=20 SENSITIVE Sensitive     AMPICILLIN/SULBACTAM <=2 SENSITIVE Sensitive     * >=100,000 COLONIES/mL ESCHERICHIA COLI  SARS Coronavirus 2 by RT PCR (hospital order, performed in [facility] hospital lab) Nasopharyngeal Nasopharyngeal Swab     Status: None   Collection Time: 05/14/2020 11:28 AM   Specimen: Nasopharyngeal Swab  Result Value Ref Range Status  SARS Coronavirus 2 NEGATIVE NEGATIVE Final    Comment: (NOTE) SARS-CoV-2 target nucleic acids are NOT DETECTED.  The SARS-CoV-2 RNA is generally detectable in upper and lower respiratory specimens during the acute phase of infection. The lowest concentration of SARS-CoV-2 viral copies this assay can detect is 250 copies / mL. A negative result does not preclude SARS-CoV-2 infection and should not be used as the sole basis for treatment or other patient management decisions.  A negative result may occur with improper specimen collection / handling, submission of specimen other than nasopharyngeal swab, presence of viral mutation(s) within the areas targeted by this assay, and inadequate number of viral copies (<250 copies / mL). A negative result must be combined with clinical observations, patient history, and epidemiological information.  Fact Sheet for Patients:   BoilerBrush.com.cy  Fact Sheet for Healthcare Providers: https://pope.com/  This test is not yet approved or  cleared by the [place] FDA and has been authorized for detection and/or diagnosis of SARS-CoV-2 by FDA under an Emergency Use Authorization (EUA).  This EUA will remain in effect (meaning this test can be used) for the duration of the COVID-19 declaration under Section 564(b)(1) of  the Act, 21 U.S.C. section 360bbb-3(b)(1), unless the authorization is terminated or revoked sooner.  Performed at [facility], [address] [place], [place] [address]       Imaging Studies   No results found.   Medications   Scheduled Meds: Continuous Infusions: . sodium chloride    . morphine 6 mg/hr (05/24/20 1744)  . sodium chloride         LOS: 8 days    Time spent: 15 minutes    Pennie Banter, DO [facility]  05/24/2020, 6:15 PM    If 7PM-7AM, please contact night-coverage. How to contact the [facility] Attending or Consulting provider 7A - 7P or covering provider during after hours 7P -7A, for this patient?    1. Check the care team in [facility] and look for a) attending/consulting TR[facility] provider listed and b) the [facility] team listed 2. Log into [email or web address] and use [place]'s universal password to access. If you do not have the password, please contact the hospital operator. 3. Locate the [facility] provider you are looking for under [facility] and page to a number that you can be directly reached. 4. If you still have difficulty reaching the provider, please page the [facility] (Director on Call) for the Hospitalists listed on amion for assistance.

## 2020-05-25 NOTE — Progress Notes (Signed)
Patient remains comfortable.  She is actively dying.  Unable to survive transportation to hospice home.  Anticipate hospital death.  Continue comfort care.  Nurse to pronounce death.  No charge.

## 2020-05-25 NOTE — Progress Notes (Signed)
Palliative: Ms. Manton is full comfort care, resting quietly in bed.  She continues to be actively dying.  No needs at this time.  Conference with attending, bedside nursing staff and transitional care team.  Plan: Comfort care, too unstable for transport, anticipate hospital death.  No charge [name], NP Palliative medicine team Team phone [phone] Greater than 50% of this time was spent counseling and coordinating care related to the above assessment and plan.

## 2020-05-26 MED ORDER — LORAZEPAM 2 MG/ML IJ SOLN
1.0000 mg | INTRAMUSCULAR | Status: DC | PRN
  Administered 2020-05-26 (×3): 1 mg via INTRAVENOUS
  Filled 2020-05-26 (×3): qty 1

## 2020-06-18 NOTE — TOC Progression Note (Signed)
Transition of Care [facility]) - Progression Note    Patient Details  Name: Lacey Ray MRN: [id number] Date of Birth: 1941-03-06  Transition of [facility]) CM/SW Contact  Trenton Founds, RN Phone Number: 06/07/2020, 2:52 PM  Clinical Narrative:   Per MD RNCM contacted Hospice liaison's Boyd Kerbs and Clydie Braun to make Hospice Home referral. MD has spoken with patient's daughter and she ok with transfer.          Expected Discharge Plan and Services                                                 Social Determinants of Health (SDOH) Interventions    Readmission Risk Interventions No flowsheet data found.

## 2020-06-18 NOTE — Death Summary Note (Signed)
DEATH SUMMARY   Patient Details  Name: Lacey Ray MRN: [id number] DOB: 06/10/1941  Admission/Discharge Information   Admit Date:  06/13/2020  Date of Death: Date of Death: 06-05-2020  Time of Death: Time of Death: 03-13-2214  Length of Stay: 2023/03/08  Referring Physician: Tarri Fuller, FNP   Reason(s) for Hospitalization  Seizures  Diagnoses  Preliminary cause of death:  Secondary Diagnoses (including complications and co-morbidities):  Principal Problem:   Comfort measures only status Active Problems:   Acidosis, lactic   DNR (do not resuscitate)   Sepsis with encephalopathy and septic shock [facility])   Palliative care by specialist   End of life care   Brief Hospital Course (including significant findings, care, treatment, and services provided and events leading to death)  Lacey Ray is a 79 y.o. year old female with significant history of seizures status post surgery at [facility] and hypertension who was admitted on 27-May-2023 with seizures and unresponsiveness.  Per EMS, patient had a seizure the previous day and again that morning lasting approximately 30 minutes.  Patient received Versed 2 mg intranasally prior to arrival to the ED.  ED Course:On arrival to the ED,  Blood pressure (!) 101/46, pulse 77, temperature (!) 93.4 F (34.1 C), temperature source Rectal, resp. rate (!) 25, SpO2 92 %. She was minimally responsive to noxious stimuli.  Initial labs showed elevated lactic acid >11, elevated white count 12.0, severe anemia (hemoglobin 2.6) and elevated BUN to creatinine ration.  Code sepsis was activated given worsening hypothermia, hypotension and abnormal labs concerning for infectious process.  Broad-spectrum antibiotics and additional fluids was administered.  Patient was admitted under PCCM service for further management of sepsis with septic shock, seizures and severe anemia.  STUDIES: 6/29-chest x-ray> with mild bilateral interstitial prominence 6/29-CT head> no acute intracranial  abnormality is seen.  SIGNIFICANT EVENTS 27-May-2023 - Patient presented to the ED  Unresponsive status post Seizure activity lasting 30 minutes May 27, 2023- Admitted to ICU under PCCM service with acute seizures and mental status changes with severe anemia and severe acidosis 6/29-received 2 units of PRBC for severe anemia 6/29-palliative medicine consulted by PCCM as patient appears to be actively dying with shallow respiration and agonal breaths.   7/1-discussions was had between patient's family and intensivist.  Family consented to transition to full comfort care. 7/2-patient full comfort care with anticipated hospital death 7/9-patient passed away peacefully and pronounced at 2114-03-13.   Pertinent Labs and Studies  Significant Diagnostic Studies CT Head Wo Contrast  Result Date: 06/07/2020 CLINICAL DATA:  Seizure. EXAM: CT HEAD WITHOUT CONTRAST TECHNIQUE: Contiguous axial images were obtained from the base of the skull through the vertex without intravenous contrast. COMPARISON:  Mar 23, 2018. FINDINGS: Brain: Mild chronic ischemic white matter disease is noted. Stable cerebellar atrophy is noted. No mass effect or midline shift is noted. Ventricular size is within normal limits. There is no evidence of mass lesion, hemorrhage or acute infarction. Vascular: No hyperdense vessel or unexpected calcification. Skull: Normal. Negative for fracture or focal lesion. Sinuses/Orbits: No acute finding. Other: None. IMPRESSION: Mild chronic ischemic white matter disease. Stable cerebellar atrophy. No acute intracranial abnormality seen. Electronically Signed   By: Lupita Raider M.D.   On: 05/31/2020 12:29   DG Chest Port 1 View  Result Date: 06/13/2020 CLINICAL DATA:  Weakness. EXAM: PORTABLE CHEST 1 VIEW COMPARISON:  No prior. FINDINGS: Borderline cardiomegaly. No pulmonary venous congestion. Mild bilateral interstitial prominence. Interstitial edema and/or pneumonitis can not be excluded.  No pleural effusion or  pneumothorax. Thoracic spine scoliosis and degenerative change. IMPRESSION: 1.  Borderline cardiomegaly.  No pulmonary venous congestion. 2. Mild bilateral interstitial prominence. Interstitial edema and/or pneumonitis cannot be excluded. Electronically Signed   By: Maisie Fus  Register   On: 06-10-20 09:37    Microbiology No results found for this or any previous visit (from the past 240 hour(s)).  Lab Basic Metabolic Panel: No results for input(s): NA, K, CL, CO2, GLUCOSE, BUN, CREATININE, CALCIUM, MG, PHOS in the last 168 hours. Liver Function Tests: No results for input(s): AST, ALT, ALKPHOS, BILITOT, PROT, ALBUMIN in the last 168 hours. No results for input(s): LIPASE, AMYLASE in the last 168 hours. No results for input(s): AMMONIA in the last 168 hours. CBC: No results for input(s): WBC, NEUTROABS, HGB, HCT, MCV, PLT in the last 168 hours. Cardiac Enzymes: No results for input(s): CKTOTAL, CKMB, CKMBINDEX, TROPONINI in the last 168 hours. Sepsis Labs: No results for input(s): PROCALCITON, WBC, LATICACIDVEN in the last 168 hours.  Procedures/Operations  None        Webb Silversmith, RN, BSN, DNP, CCRN, FNP-C Estate manager/land agent

## 2020-06-18 NOTE — Progress Notes (Signed)
[facility] Room 112 [facility]) Hospital Liaison RN note:  Received request from Dr. Sherryll Burger for family interest in Hospice Home. Chart reviewed and eligibility has been approved.  Spoke with daughter, Kalinda Romaniello, over the phone to acknowledge referral and to initiate education related to hospice philosophy and services. She verbalized understanding.  Unfortunately, Hospice Home does not have a bed available today. Daughter, Sherrell  and hospital care team are aware.   [facility] Liaison will follow for bed availability.   Please call for any hospice related questions or concerns.  Thank you for the opportunity to participate in this patient's care.  Cyndra Numbers, RN [facility] Hospital Liaison [phone]

## 2020-06-18 NOTE — Progress Notes (Signed)
Patient continues to be comfortable, in active dying process.  Nurse mention her having seizure.  I have ordered Ativan as needed.  I have also requested hospice liaison to evaluate her for hospice home as family is agreeable for same.  There are no hospice home beds available today and she is placed on the waiting list.  I have updated patient's daughter who is in agreement with care plans.  No charge.

## 2020-06-18 DEATH — deceased

## 2020-08-31 IMAGING — CT CT HEAD W/O CM
3 series · 16 of 47 positions shown, 19 images · non-contrast
Comparison: March 23, 2018.

CLINICAL DATA: Seizure.

EXAM:
CT HEAD WITHOUT CONTRAST
TECHNIQUE: Contiguous axial images were obtained from the base of the skull
through the vertex without intravenous contrast.

[Series 2: head wo · axial · 0.43mm/px · z∈[-19,+106]mm · 10 of 31 slices shown, 13 images]
[im 3/31  brain]
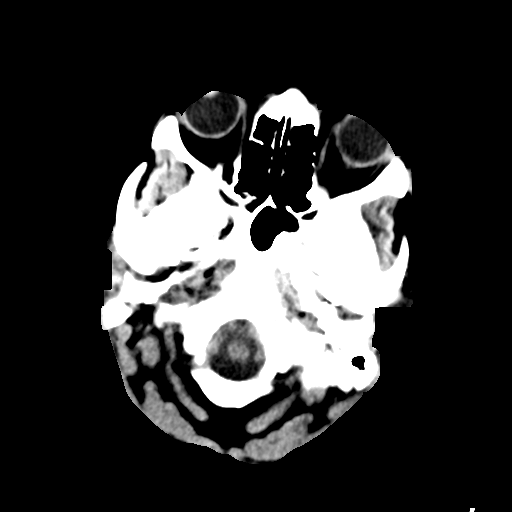
[im 3/31  bone]
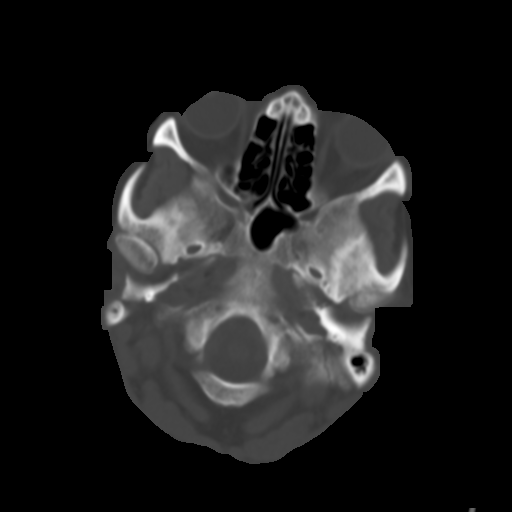
[im 6/31  brain]
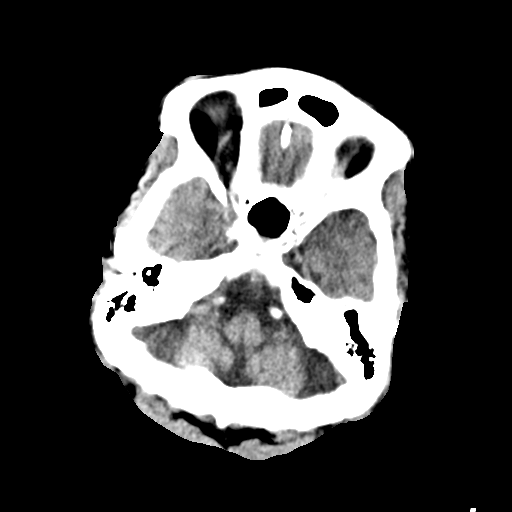
[im 9/31  brain]
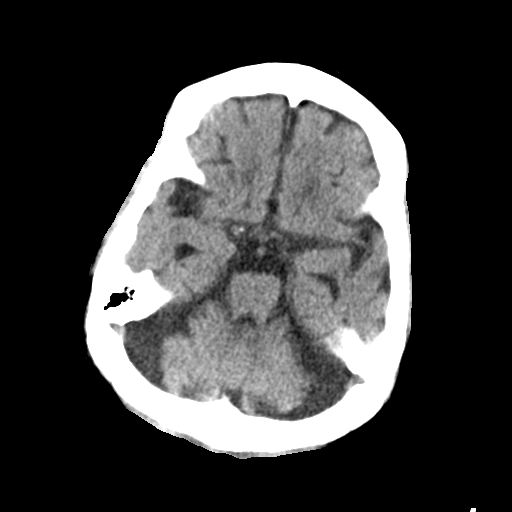
[im 11/31  brain]
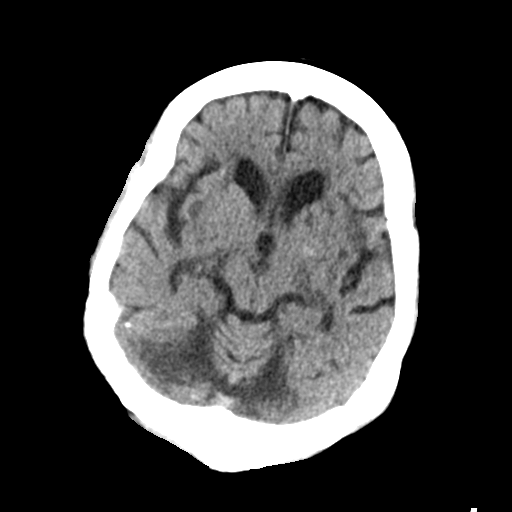
[im 14/31  brain]
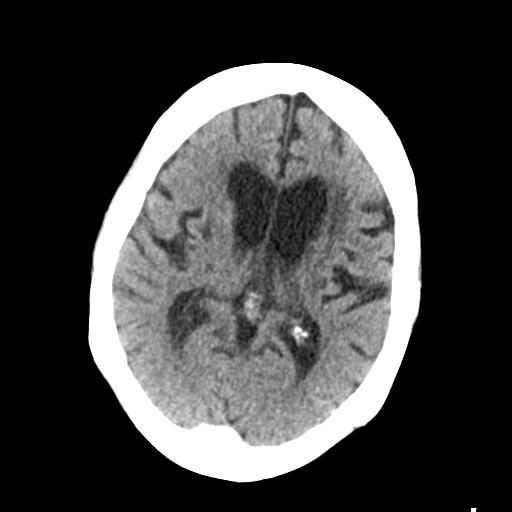
[im 14/31  bone]
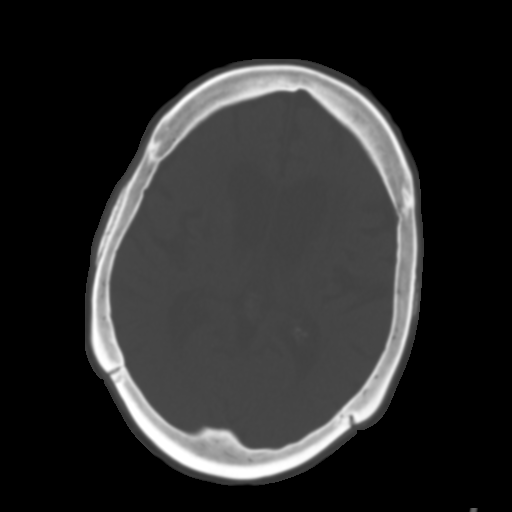
[im 17/31  brain]
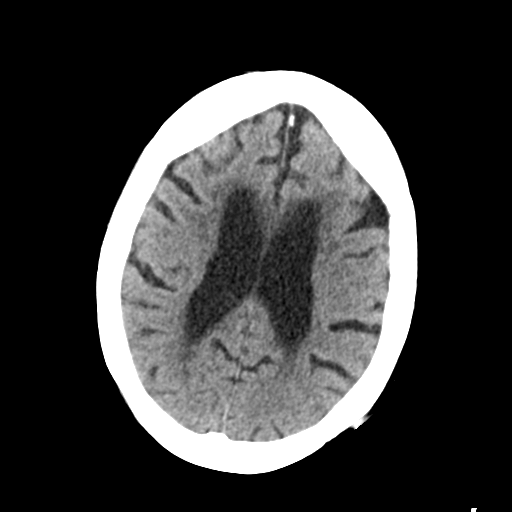
[im 20/31  brain]
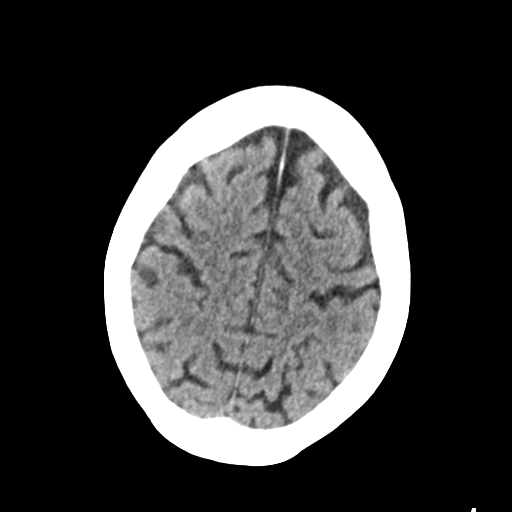
[im 23/31  brain]
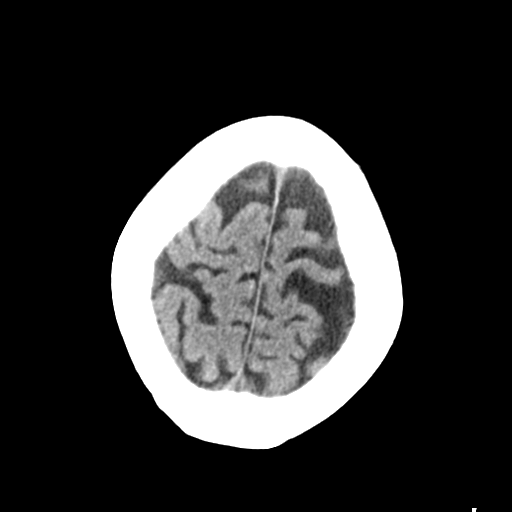
[im 25/31  brain]
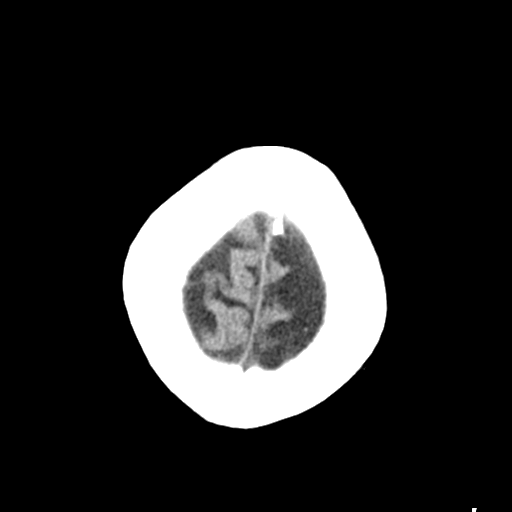
[im 25/31  bone]
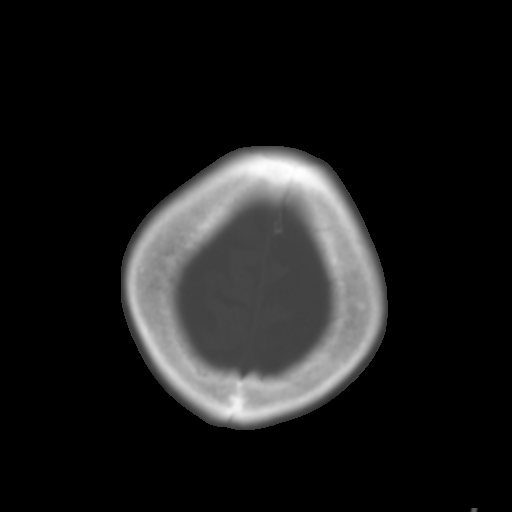
[im 28/31  brain]
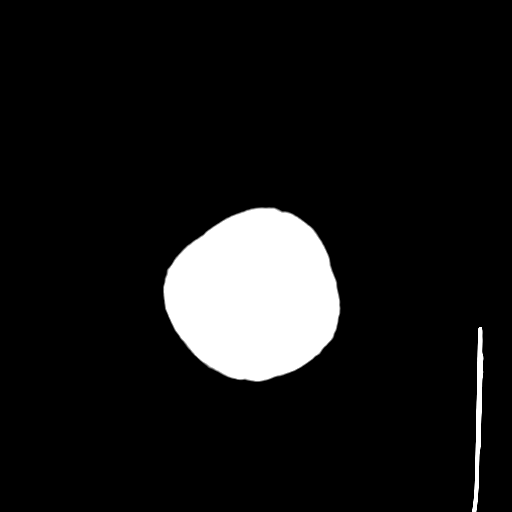

[Series 4: coronal soft tissue · coronal · 0.32mm/px · 3 of 63 slices shown]
[im 21/63  brain]
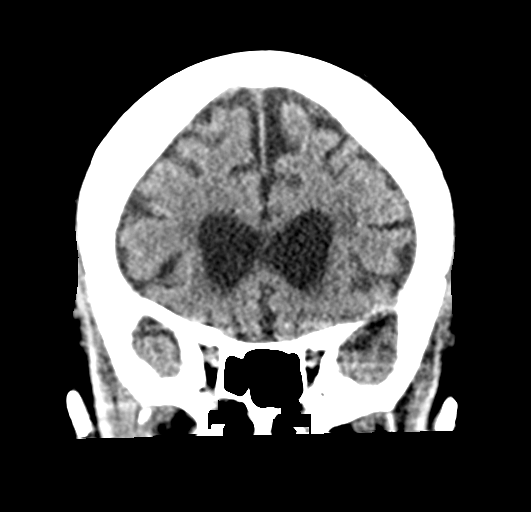
[im 28/63  brain]
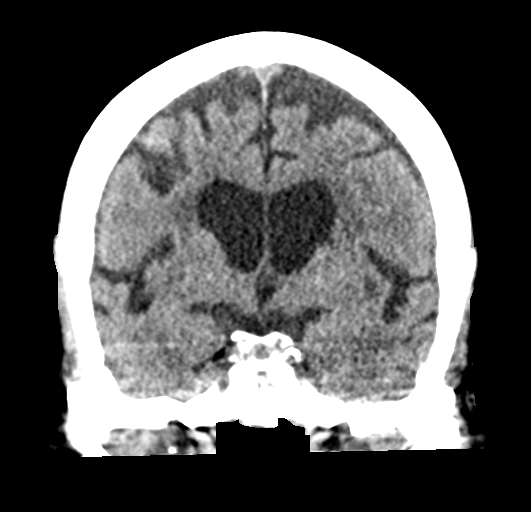
[im 35/63  brain]
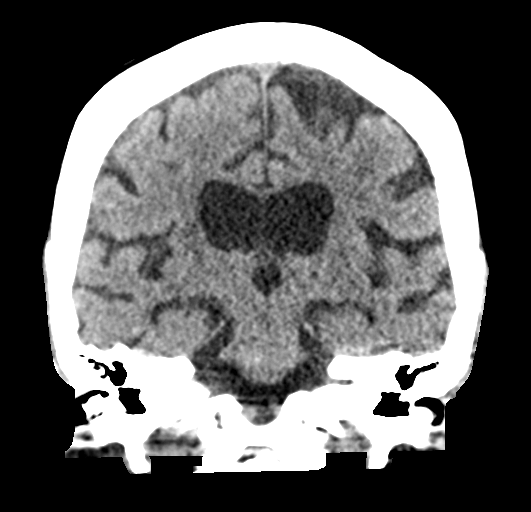

[Series 5: sagittal soft tissue · sagittal · 0.32mm/px · 3 of 57 slices shown]
[im 19/57  brain]
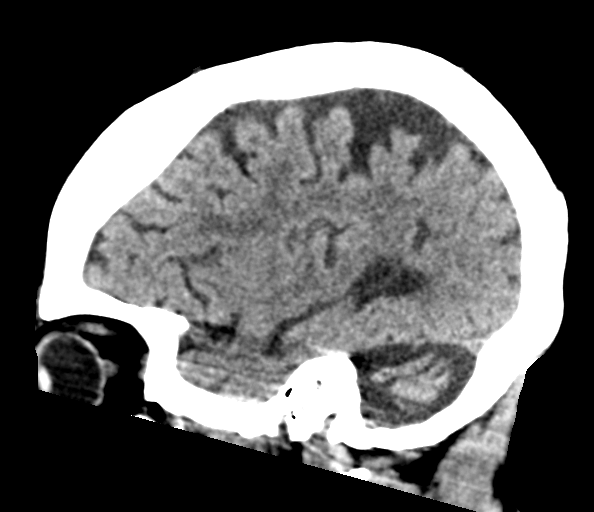
[im 29/57  brain]
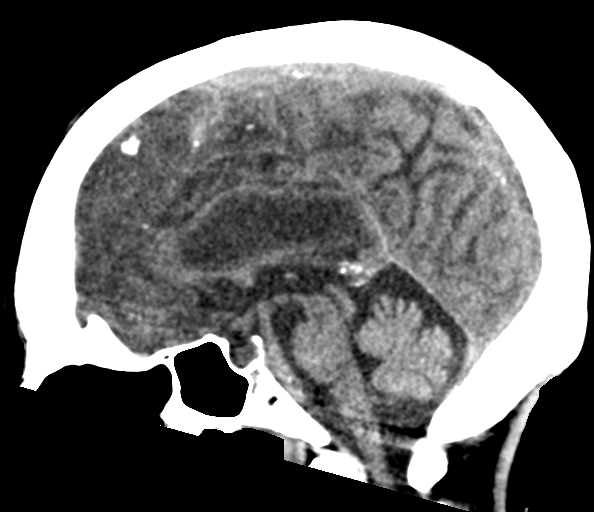
[im 38/57  brain]
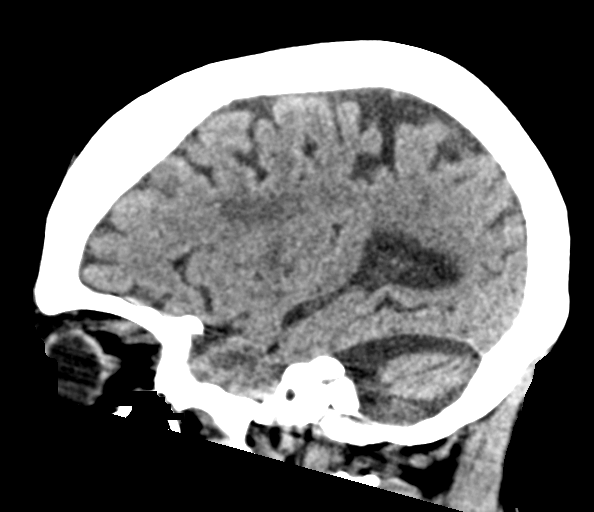

[16 of 47 positions shown; findings below may reference images not displayed]

FINDINGS: Brain: Mild chronic ischemic white matter disease is noted. Stable
cerebellar atrophy is noted. No mass effect or midline shift is
noted. Ventricular size is within normal limits. There is no
evidence of mass lesion, hemorrhage or acute infarction.

Vascular: No hyperdense vessel or unexpected calcification.

Skull: Normal. Negative for fracture or focal lesion.

Sinuses/Orbits: No acute finding.

Other: None.
IMPRESSION: Mild chronic ischemic white matter disease. Stable cerebellar
atrophy. No acute intracranial abnormality seen.
